# Patient Record
Sex: Female | Born: 1965
Health system: Southern US, Community
[De-identification: ages and names within clinical notes are randomized; demographics above are authoritative.]

## PROBLEM LIST (undated history)

## (undated) DIAGNOSIS — F3289 Other specified depressive episodes: Secondary | ICD-10-CM

## (undated) DIAGNOSIS — R011 Cardiac murmur, unspecified: Secondary | ICD-10-CM

## (undated) DIAGNOSIS — E7800 Pure hypercholesterolemia, unspecified: Secondary | ICD-10-CM

## (undated) DIAGNOSIS — B001 Herpesviral vesicular dermatitis: Secondary | ICD-10-CM

## (undated) DIAGNOSIS — K635 Polyp of colon: Secondary | ICD-10-CM

## (undated) DIAGNOSIS — F329 Major depressive disorder, single episode, unspecified: Secondary | ICD-10-CM

## (undated) DIAGNOSIS — T7840XA Allergy, unspecified, initial encounter: Secondary | ICD-10-CM

## (undated) DIAGNOSIS — K5790 Diverticulosis of intestine, part unspecified, without perforation or abscess without bleeding: Secondary | ICD-10-CM

## (undated) DIAGNOSIS — K648 Other hemorrhoids: Secondary | ICD-10-CM

## (undated) DIAGNOSIS — E559 Vitamin D deficiency, unspecified: Secondary | ICD-10-CM

## (undated) HISTORY — DX: Polyp of colon: K63.5

## (undated) HISTORY — DX: Other hemorrhoids: K64.8

## (undated) HISTORY — DX: Major depressive disorder, single episode, unspecified: F32.9

## (undated) HISTORY — DX: Diverticulosis of intestine, part unspecified, without perforation or abscess without bleeding: K57.90

## (undated) HISTORY — DX: Other specified depressive episodes: F32.89

## (undated) HISTORY — DX: Cardiac murmur, unspecified: R01.1

## (undated) HISTORY — DX: Pure hypercholesterolemia, unspecified: E78.00

## (undated) HISTORY — PX: WISDOM TOOTH EXTRACTION: SHX21

## (undated) HISTORY — DX: Vitamin D deficiency, unspecified: E55.9

## (undated) HISTORY — DX: Allergy, unspecified, initial encounter: T78.40XA

## (undated) HISTORY — DX: Herpesviral vesicular dermatitis: B00.1

---

## 1997-08-12 ENCOUNTER — Other Ambulatory Visit: Admission: RE | Admit: 1997-08-12 | Discharge: 1997-08-12 | Payer: Self-pay | Admitting: *Deleted

## 1998-02-27 ENCOUNTER — Inpatient Hospital Stay (HOSPITAL_COMMUNITY): Admission: AD | Admit: 1998-02-27 | Discharge: 1998-02-27 | Payer: Self-pay | Admitting: *Deleted

## 1998-03-23 ENCOUNTER — Inpatient Hospital Stay (HOSPITAL_COMMUNITY): Admission: AD | Admit: 1998-03-23 | Discharge: 1998-03-25 | Payer: Self-pay | Admitting: *Deleted

## 1998-04-30 ENCOUNTER — Other Ambulatory Visit: Admission: RE | Admit: 1998-04-30 | Discharge: 1998-04-30 | Payer: Self-pay | Admitting: *Deleted

## 2000-11-28 ENCOUNTER — Other Ambulatory Visit: Admission: RE | Admit: 2000-11-28 | Discharge: 2000-11-28 | Payer: Self-pay | Admitting: *Deleted

## 2002-04-01 ENCOUNTER — Ambulatory Visit (HOSPITAL_COMMUNITY): Admission: RE | Admit: 2002-04-01 | Discharge: 2002-04-01 | Payer: Self-pay | Admitting: Obstetrics and Gynecology

## 2002-04-01 ENCOUNTER — Encounter: Payer: Self-pay | Admitting: Obstetrics and Gynecology

## 2002-04-08 ENCOUNTER — Other Ambulatory Visit: Admission: RE | Admit: 2002-04-08 | Discharge: 2002-04-08 | Payer: Self-pay | Admitting: Obstetrics and Gynecology

## 2004-04-14 ENCOUNTER — Other Ambulatory Visit: Admission: RE | Admit: 2004-04-14 | Discharge: 2004-04-14 | Payer: Self-pay | Admitting: Obstetrics and Gynecology

## 2004-06-21 ENCOUNTER — Encounter: Admission: RE | Admit: 2004-06-21 | Discharge: 2004-06-21 | Payer: Self-pay | Admitting: Family Medicine

## 2004-10-20 ENCOUNTER — Encounter: Admission: RE | Admit: 2004-10-20 | Discharge: 2004-10-20 | Payer: Self-pay | Admitting: Internal Medicine

## 2006-08-29 ENCOUNTER — Ambulatory Visit (HOSPITAL_COMMUNITY): Admission: RE | Admit: 2006-08-29 | Discharge: 2006-08-29 | Payer: Self-pay | Admitting: Family Medicine

## 2006-10-11 ENCOUNTER — Other Ambulatory Visit: Admission: RE | Admit: 2006-10-11 | Discharge: 2006-10-11 | Payer: Self-pay | Admitting: Endocrinology

## 2006-10-11 LAB — HM PAP SMEAR: HM Pap smear: NORMAL

## 2006-12-25 IMAGING — CR DG CHEST 2V
2 series · 2 of 2 positions shown · non-contrast
Comparison: None.

CLINICAL DATA: Cough. 
 CHEST, TWO VIEWS

[view not recorded (1 of 2)]
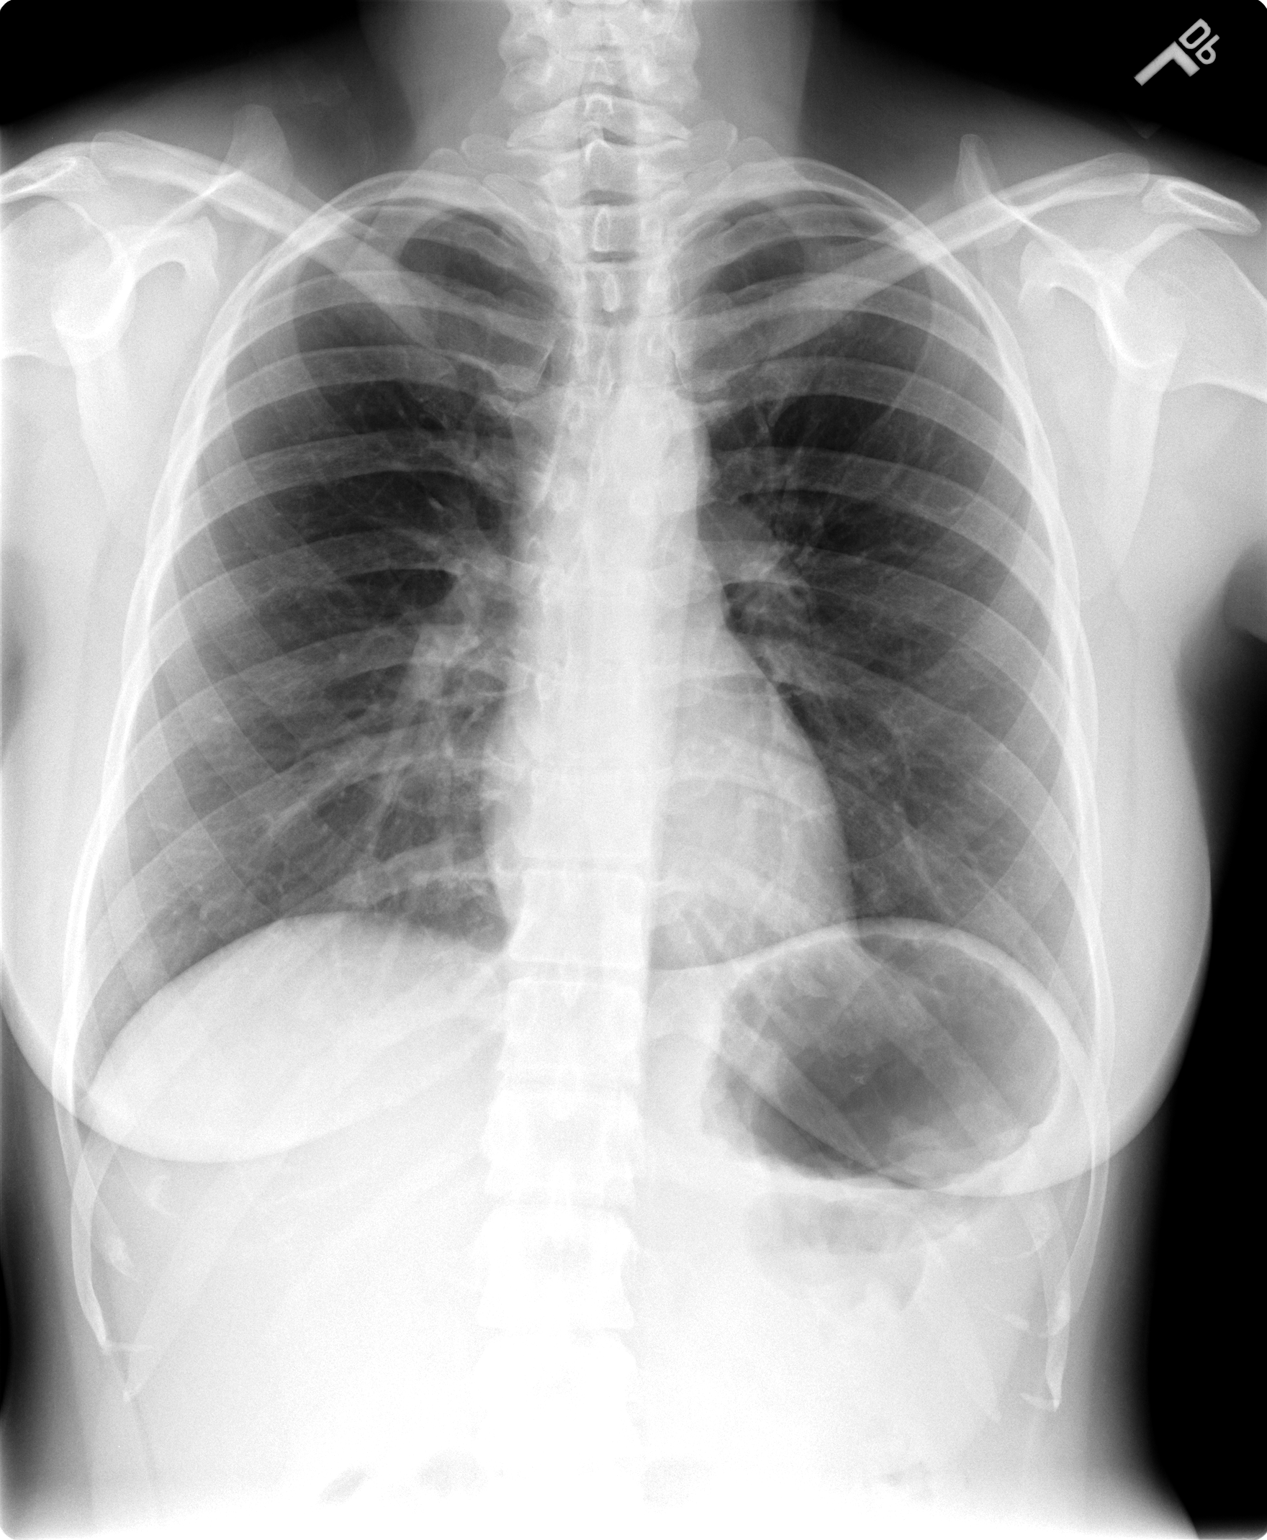

[view not recorded (2 of 2)]
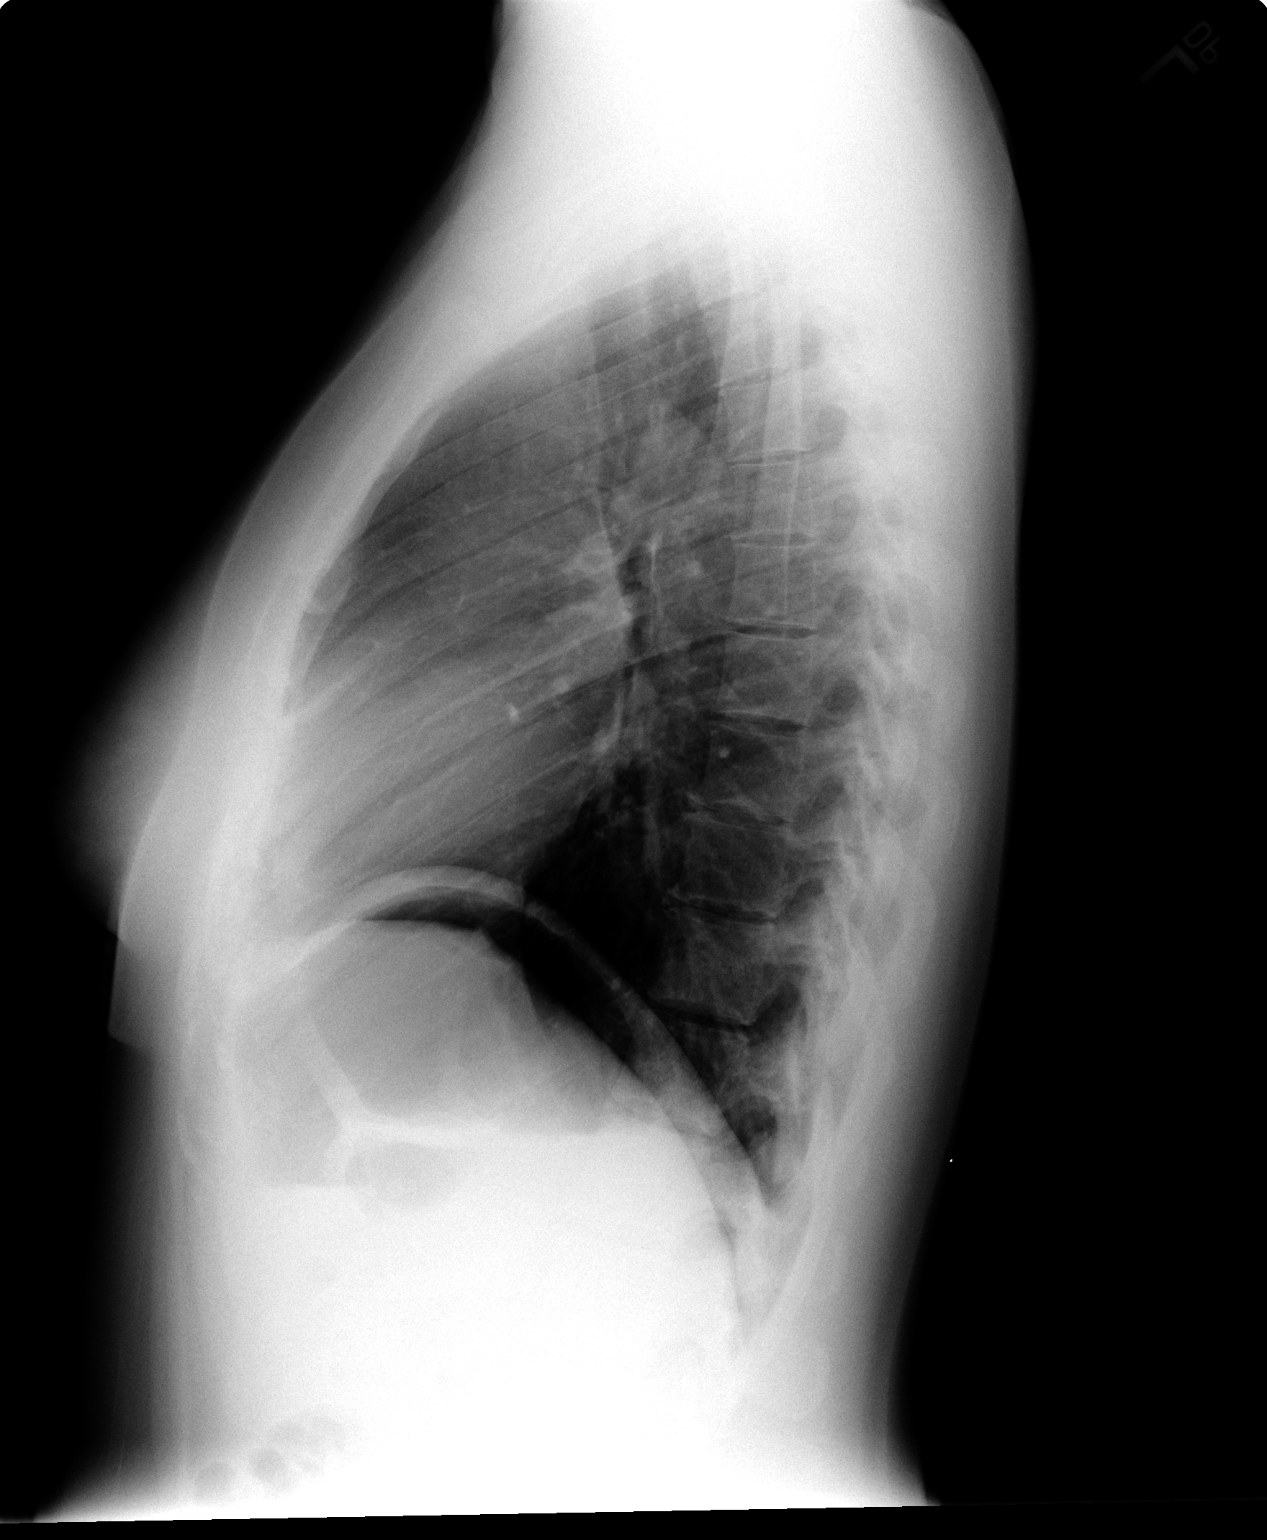

[2 of 2 positions shown; findings below may reference images not displayed]

Heart and lungs normal.  No pleural fluid or osseous lesions.  There is a mild thoracolumbar scoliosis.
IMPRESSION: No active cardiopulmonary disease.

## 2010-10-20 ENCOUNTER — Encounter: Payer: Self-pay | Admitting: Family Medicine

## 2010-10-20 ENCOUNTER — Ambulatory Visit (INDEPENDENT_AMBULATORY_CARE_PROVIDER_SITE_OTHER): Payer: Managed Care, Other (non HMO) | Admitting: Family Medicine

## 2010-10-20 VITALS — BP 116/74 | HR 68 | Ht 62.0 in | Wt 146.0 lb

## 2010-10-20 DIAGNOSIS — M7630 Iliotibial band syndrome, unspecified leg: Secondary | ICD-10-CM

## 2010-10-20 DIAGNOSIS — M629 Disorder of muscle, unspecified: Secondary | ICD-10-CM

## 2010-10-20 DIAGNOSIS — B009 Herpesviral infection, unspecified: Secondary | ICD-10-CM

## 2010-10-20 MED ORDER — CELECOXIB 200 MG PO CAPS: 200.0000 mg | ORAL_CAPSULE | Freq: Every day | ORAL | Status: DC

## 2010-10-20 MED ORDER — VALACYCLOVIR HCL 1 G PO TABS: 500.0000 mg | ORAL_TABLET | Freq: Two times a day (BID) | ORAL | Status: DC

## 2010-10-20 NOTE — Progress Notes (Signed)
Chief complaint: right hip pain x 2 years, left shoulder x 1 year and left wrist. Took some Celebrex and feels much better. (declines flu shot)  Exercises up to 3x/week, and walks twice a week x 30 minutes 2 years ago started having R hip pain when walking a lot.  Has been ongoing since then.  Tender to touch on the bone.  Sometimes, sporadically will also have R knee pain.  L shoulder pain x 1 year.  Started after getting new pillows.  Feels bruised to touch on the bone (acromion).  Denies having any pain with movement.  Slight tingling in L 4th and 5th fingers  Discomfort is 2/10, not bad, tolerable.  3 months ago, started aching in both knees, both hips, ached all over getting out of bed--back, shoulders, everywhere.  Started having pain in L hand, lifting anything (purse, opening water bottle). Felt sore all over like that for about 8 weeks--pain was in multiple joints.  3 weeks ago, she tried her mother's Celebrex, and the next day she was better. Took 3 pills in a row, felt much better.  On a cruise last week, didn't take the Celebrex because of being on cruise, taking Dramamine, drinking wine.  Only took it 2-3 times over the last week.  Now is back to baseline, just pain at R hip and L shoulder.   No fevers, tick bites, URI symptoms.  Had tried glucosamine a year ago, not taking anymore (didn't help).  Denies family history of autoimmune diseases.  Never had any rash  Past Medical History  Diagnosis Date  . Herpes labialis     also gets outbreaks of herpes infection on her R thumb    History reviewed. No pertinent past surgical history.  History   Social History  . Marital Status: Married    Spouse Name: N/A    Number of Children: 2  . Years of Education: N/A   Occupational History  . 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 Smoking status: Never Smoker   . Smokeless tobacco: Never Used  . Alcohol Use: Yes     3-6 glasses per week (1-2 bottles of  wine/week)  . Drug Use: No  . Sexually Active: Not on file   Other Topics Concern  . Not on file   Social History Narrative   Separated, soon to be legally divorced    Family History  Problem Relation Age of Onset  . Asthma Mother   . Hypertension Father   . Asthma Son   . Cancer Maternal Aunt     melanoma    Current outpatient prescriptions:valACYclovir (VALTREX) 1000 MG tablet, Take 0.5 tablets (500 mg total) by mouth 2 (two) times daily. As directed, as needed for recurrent infection, Disp: 30 tablet, Rfl: 1;  celecoxib (CELEBREX) 200 MG capsule, Take 1 capsule (200 mg total) by mouth daily. Take as needed for pain, but use regularly for at least a week initially, Disp: 30 capsule, Rfl: 0  No Known Allergies  ROS:  See above--no fevers, weight changes, skin rash, GI complaints, GU complaints, chest pain, URI complaints, SOB or other concerns, except MSK as per HPI  PHYSICAL EXAM: BP 116/74  Pulse 68  Ht 5\' 2"  (1.575 m)  Wt 146 lb (66.225 kg)  BMI 26.70 kg/m2  LMP 10/17/2010 Pleasant female, in no distress Neck: no lymphadenopathy or mass, C-spine nontender.  Tender L posterior acromion.  No erythema, warmth.  Normal strength, FROM of  both UE's. Tender at lateral R ASIS, slightly tender along ITB.  Nontender at R trochanteric bursa.  FROM of hip and knee.  Pain and decreased range of motion with bending forward with left leg crossed in front of right (pain in R hip) Heart: regular rate and rhythm Lungs: clear Abdomen: nontender Skin: no rash Psych: normal mood, affect, hygiene and grooming  ASSESSMENT/PLAN: 1. Iliotibial band syndrome  celecoxib (CELEBREX) 200 MG capsule, DISCONTINUED: celecoxib (CELEBREX) 200 MG capsule  2. HSV (herpes simplex virus) infection  valACYclovir (VALTREX) 1000 MG tablet, DISCONTINUED: valACYclovir (VALTREX) 1000 MG tablet   Discussed ice/heat, taught proper stretches Likely the shoulder pain will also improve with anti-inflammatories,  doesn't appear to be a problem with the joint itself.  If Celebrex not covered by insurance, discussed that we'd change to Naprosyn. Take meds regularly for 1-2 weeks.  Consider PT or chiro (with ART--ie Dr. Vear Clock) if ongoing/worsening pain  F/u for CPE/pap

## 2010-10-20 NOTE — Patient Instructions (Addendum)
ITB (iliotibeal band) Polyarthritis--? Etiology.  Resolved now.  Return if symptoms recur for thorough work-up  If Celebrex isn't covered, will change to Naprosyn.  Naproxyn needs to be taken twice daily, and take with food   Iliotibial Band Syndrome Iliotibial band syndrome is pain in the outer, upper thigh. The pain is due to an inflammation (soreness) of the iliotibial band. This is a band of thick fibrous tissue that runs down the outside of the thigh. The iliotibial band begins at the hip. It extends to the outer side of the shin bone (tibia) just below the knee joint. The band works with the thigh muscles. Together they provide stability to the outside of the knee joint. Iliotibial band syndrome (ITBS) occurs when there is inflammation to this band of tissue. The irritation usually occurs over the outside of the knee joint, at the lateral epicondyle (the end of the femur bone.) The iliotibial band crosses bone and muscle at this point. Between these structures is a bursa (a cushioning sac). The bursa should make possible a smooth gliding motion. However, when inflamed, the iliotibial band does not glide easily. When inflamed, there is pain with motion of the knee. Usually the pain worsens with continued movement and the pain goes away with rest. This problem usually arises when there is a sudden increase in sports activities involving the lower extremities (your legs). Running, and playing soccer or basketball are examples of activities causing this. Others who are prone to ITBS include individuals with mechanical problems such as leg length differences, abnormality of walking, bowed legs etc. This diagnosis (learning what is wrong) is made by examination. X-rays are usually normal if only soft tissue inflammation is present. Treatment of ITBS begins with proper footwear, icing the area of pain, stretching, and resting for a period of time. Incorporating low-impact cross-training activities may also  help. Your caregiver may prescribe anti-inflammatory medications as well. HOME CARE INSTRUCTIONS  Apply ice to the sore area for 15 minutes, 2-3 times per day. Put the ice in a plastic bag and place a towel between the bag of ice and your skin.   Limit excessive training or eliminate training until pain goes away.   While pain is present, you may use gentle range of motion. Do not resume regular use until instructed by your caregiver. Begin use gradually. Do not increase use to the point of pain. If pain does develop, decrease use and continue the above measures. Gradually increase activities that do not cause discomfort. Do this until you finally achieve normal use.   Perform low impact activities while pain is present. Wear proper footwear.   Only take over-the-counter or prescription medicines for pain, discomfort, or fever as directed by your caregiver.  SEEK MEDICAL CARE IF:  Your pain increases or pain is not controlled with medications.   You develop new, unexplained symptoms (problems), or an increase of the symptoms that brought you to your caregiver.   Your pain and symptoms are not improving or are getting worse.  Document Released: 07/01/2001 Document Re-Released: 04/07/2008 [facility] Patient Information 2011 [place], [place].

## 2010-10-26 ENCOUNTER — Encounter: Payer: Self-pay | Admitting: Family Medicine

## 2010-10-26 ENCOUNTER — Other Ambulatory Visit: Payer: Self-pay | Admitting: Family Medicine

## 2010-10-26 DIAGNOSIS — M7630 Iliotibial band syndrome, unspecified leg: Secondary | ICD-10-CM

## 2010-10-26 MED ORDER — NAPROXEN 500 MG PO TABS: 500.0000 mg | ORAL_TABLET | Freq: Two times a day (BID) | ORAL | Status: DC

## 2010-10-27 ENCOUNTER — Encounter: Payer: Self-pay | Admitting: Family Medicine

## 2010-12-01 ENCOUNTER — Encounter: Payer: Self-pay | Admitting: Family Medicine

## 2010-12-01 ENCOUNTER — Other Ambulatory Visit (HOSPITAL_COMMUNITY)
Admission: RE | Admit: 2010-12-01 | Discharge: 2010-12-01 | Disposition: A | Discharge status: Home / Self Care | Payer: Managed Care, Other (non HMO) | Source: Ambulatory Visit | Attending: Family Medicine | Admitting: Family Medicine

## 2010-12-01 ENCOUNTER — Ambulatory Visit (INDEPENDENT_AMBULATORY_CARE_PROVIDER_SITE_OTHER): Payer: Managed Care, Other (non HMO) | Admitting: Family Medicine

## 2010-12-01 VITALS — BP 114/76 | HR 68 | Ht 62.0 in | Wt 146.0 lb

## 2010-12-01 DIAGNOSIS — R5383 Other fatigue: Secondary | ICD-10-CM

## 2010-12-01 DIAGNOSIS — Z23 Encounter for immunization: Secondary | ICD-10-CM

## 2010-12-01 DIAGNOSIS — Z0000 Encounter for general adult medical examination without abnormal findings: Secondary | ICD-10-CM

## 2010-12-01 DIAGNOSIS — R5381 Other malaise: Secondary | ICD-10-CM

## 2010-12-01 DIAGNOSIS — E7800 Pure hypercholesterolemia, unspecified: Secondary | ICD-10-CM

## 2010-12-01 DIAGNOSIS — Z113 Encounter for screening for infections with a predominantly sexual mode of transmission: Secondary | ICD-10-CM | POA: Insufficient documentation

## 2010-12-01 DIAGNOSIS — Z01419 Encounter for gynecological examination (general) (routine) without abnormal findings: Secondary | ICD-10-CM | POA: Insufficient documentation

## 2010-12-01 LAB — CBC WITH DIFFERENTIAL/PLATELET
Basophils Absolute: 0 10*3/uL (ref 0.0–0.1)
Basophils Relative: 1 % (ref 0–1)
Eosinophils Absolute: 0.1 10*3/uL (ref 0.0–0.7)
HCT: 43.3 % (ref 36.0–46.0)
MCH: 30.9 pg (ref 26.0–34.0)
MCHC: 32.6 g/dL (ref 30.0–36.0)
Monocytes Absolute: 0.3 10*3/uL (ref 0.1–1.0)
Neutro Abs: 3.7 10*3/uL (ref 1.7–7.7)
RDW: 12.7 % (ref 11.5–15.5)

## 2010-12-01 LAB — LIPID PANEL
HDL: 73 mg/dL (ref >39)
LDL Cholesterol: 128 mg/dL — ABNORMAL HIGH (ref 0–99)
Total CHOL/HDL Ratio: 3.2 Ratio
Triglycerides: 153 mg/dL — ABNORMAL HIGH (ref <150)
VLDL: 31 mg/dL (ref 0–40)

## 2010-12-01 LAB — COMPREHENSIVE METABOLIC PANEL
AST: 16 U/L (ref 0–37)
Albumin: 4.8 g/dL (ref 3.5–5.2)
Alkaline Phosphatase: 58 U/L (ref 39–117)
BUN: 14 mg/dL (ref 6–23)
Creat: 0.75 mg/dL (ref 0.50–1.10)
Glucose, Bld: 84 mg/dL (ref 70–99)
Potassium: 5 mEq/L (ref 3.5–5.3)
Total Bilirubin: 0.5 mg/dL (ref 0.3–1.2)

## 2010-12-01 LAB — POCT URINALYSIS DIPSTICK
Bilirubin, UA: NEGATIVE
Glucose, UA: NEGATIVE
Leukocytes, UA: NEGATIVE
Nitrite, UA: NEGATIVE
Urobilinogen, UA: NEGATIVE
pH, UA: 5

## 2010-12-01 LAB — HM PAP SMEAR: HM Pap smear: NORMAL

## 2010-12-01 NOTE — Patient Instructions (Addendum)
HEALTH MAINTENANCE RECOMMENDATIONS:  It is recommended that you get at least 30 minutes of aerobic exercise at least 5 days/week (for weight loss, you may need as much as 60-90 minutes). This can be any activity that gets your heart rate up. This can be divided in 10-15 minute intervals if needed, but try and build up your endurance at least once a week.  Weight bearing exercise is also recommended twice weekly.  Eat a healthy diet with lots of vegetables, fruits and fiber.  "Colorful" foods have a lot of vitamins (ie green vegetables, tomatoes, red peppers, etc).  Limit sweet tea, regular sodas and alcoholic beverages, all of which has a lot of calories and sugar.  Up to 1 alcoholic drink daily may be beneficial for women (unless trying to lose weight, watch sugars).  Drink a lot of water.  Calcium recommendations are 1200-1500 mg daily (1500 mg for postmenopausal women or women without ovaries), and vitamin D 1000 IU daily.  This should be obtained from diet and/or supplements (vitamins), and calcium should not be taken all at once, but in divided doses.  Monthly self breast exams and yearly mammograms for women over the age of 18 is recommended.  Sunscreen of at least SPF 30 should be used on all sun-exposed parts of the skin when outside between the hours of 10 am and 4 pm (not just when at beach or pool, but even with exercise, golf, tennis, and yard work!)  Use a sunscreen that says "broad spectrum" so it covers both UVA and UVB rays, and make sure to reapply every 1-2 hours.  Remember to change the batteries in your smoke detectors when changing your clock times in the spring and fall.  Use your seat belt every time you are in a car, and please drive safely and not be distracted with cell phones and texting while driving.  Routine eye exam is recommended

## 2010-12-01 NOTE — Progress Notes (Signed)
Sherry Carter is a 45 y.o. female who presents for a complete physical.  She has the following concerns: None.  Needs tetanus shot, and wants labs done.  She is fasting today.  She is in a sexual relationship with someone who has had a vasectomy, not using condoms.  Has been with him for a year.  Declines HIV testing that was offered.  Immunization History  Administered Date(s) Administered  . Tdap 12/01/2010   Last Pap smear: 2008 Last mammogram: 2008 at [facility] Last colonoscopy: never Last DEXA: never Dentist: twice yearly Ophtho: never Exercise: walks twice a week, 45 minutes. Has an elliptical and she plans to try and exercise more frequently  Past Medical History  Diagnosis Date  . Herpes labialis     also gets outbreaks of herpes infection on her R thumb  . Depressive disorder, not elsewhere classified   . Pure hypercholesterolemia     History reviewed. No pertinent past surgical history.  History   Social History  . Marital Status: Married    Spouse Name: N/A    Number of Children: 2  . Years of Education: N/A   Occupational History  . 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 Smoking status: Never Smoker   . Smokeless tobacco: Never Used  . Alcohol Use: Yes     3-6 glasses per week (1-2 bottles of wine/week)  . Drug Use: No  . Sexually Active: Not on file   Other Topics Concern  . Not on file   Social History Narrative   Separated, soon to be legally divorced    Family History  Problem Relation Age of Onset  . Asthma Mother   . Hypertension Father   . Asthma Son   . Cancer Maternal Aunt     melanoma    Current outpatient prescriptions:naproxen (NAPROSYN) 500 MG tablet, Take 1 tablet (500 mg total) by mouth 2 (two) times daily with a meal. After initial symptoms resolve, may use as needed for pain, Disp: 30 tablet, Rfl: 1;  valACYclovir (VALTREX) 1000 MG tablet, Take 0.5 tablets (500 mg total) by mouth 2 (two) times  daily. As directed, as needed for recurrent infection, Disp: 30 tablet, Rfl: 1  No Known Allergies  ROS:  The patient denies anorexia, fever, weight changes, headaches,  vision changes, decreased hearing, ear pain, sore throat, breast concerns, chest pain, palpitations, dizziness, syncope, dyspnea on exertion, cough, swelling, nausea, vomiting, diarrhea, constipation, abdominal pain, melena, hematochezia, indigestion/heartburn, hematuria, incontinence, dysuria, irregular menstrual cycles (twice in the last 2 years had a very heavy period), vaginal discharge, odor or itch, genital lesions, numbness, tingling, weakness, tremor, suspicious skin lesions, depression, anxiety, abnormal bleeding/bruising, or enlarged lymph nodes. +R hip, knee pain intermittently, relieved by Aleve.  Some shoulder pain when she wakes up in the morning (the side she slept on)  PHYSICAL EXAM: BP 114/76  Pulse 68  Ht 5\' 2"  (1.575 m)  Wt 146 lb (66.225 kg)  BMI 26.70 kg/m2  LMP 11/02/2010  General Appearance:    Alert, cooperative, no distress, appears stated age  Head:    Normocephalic, without obvious abnormality, atraumatic  Eyes:    PERRL, conjunctiva/corneas clear, EOM's intact, fundi    benign  Ears:    Normal TM's and external ear canals  Nose:   Nares normal, mucosa normal, no drainage or sinus   tenderness  Throat:   Lips, mucosa, and tongue normal; teeth and gums normal  Neck:  Supple, no lymphadenopathy;  thyroid:  no   enlargement/tenderness/nodules; no carotid   bruit or JVD  Back:    Spine nontender, no curvature, ROM normal, no CVA     tenderness  Lungs:     Clear to auscultation bilaterally without wheezes, rales or     ronchi; respirations unlabored  Chest Wall:    No tenderness or deformity   Heart:    Regular rate and rhythm, S1 and S2 normal, no murmur, rub   or gallop  Breast Exam:    No tenderness, masses, or nipple discharge or inversion.      No axillary lymphadenopathy  Abdomen:     Soft,  non-tender, nondistended, normoactive bowel sounds,    no masses, no hepatosplenomegaly  Genitalia:    Normal external genitalia without lesions.  BUS and vagina normal; cervix without lesions, or cervical motion tenderness. Small amount of yellow discharge at cervical os.  Uterus and adnexa not enlarged, nontender, no masses.  Pap performed  Rectal:    Exam declined by patient.  Extremities:   No clubbing, cyanosis or edema  Pulses:   2+ and symmetric all extremities  Skin:   Skin color, texture, turgor normal, no rashes or lesions  Lymph nodes:   Cervical, supraclavicular, and axillary nodes normal  Neurologic:   CNII-XII intact, normal strength, sensation and gait; reflexes 2+ and symmetric throughout          Psych:   Normal mood, affect, hygiene and grooming.     ASSESSMENT/PLAN:  1. Routine general medical examination at a health care facility  POCT Urinalysis Dipstick, Visual acuity screening, Vitamin D 25 hydroxy, CBC with Differential, Comprehensive metabolic panel, Lipid panel, TSH, Cytology - PAP  2. Need for Tdap vaccination  Tdap vaccine greater than or equal to 7yo IM  3. Pure hypercholesterolemia  Lipid panel   borderline lipids in past, with good HDL.  Last checked in 2008  4. Other malaise and fatigue  Vitamin D 25 hydroxy, CBC with Differential, Comprehensive metabolic panel, TSH   Discussed monthly self breast exams and yearly mammograms after the age of 28; at least 30 minutes of aerobic activity at least 5 days/week; proper sunscreen use reviewed; healthy diet, including goals of calcium and vitamin D intake and alcohol recommendations (less than or equal to 1 drink/day) reviewed; regular seatbelt use; changing batteries in smoke detectors.  Immunization recommendations discussed--TdaP given, declines flu shot.  Colonoscopy recommendations reviewed--age 14.  Hemoccult cards given.  Routine eye exam is recommended.  Discussed need for annual mammograms and self breast exams  (not doing)

## 2010-12-02 ENCOUNTER — Encounter: Payer: Self-pay | Admitting: Family Medicine

## 2010-12-02 LAB — VITAMIN D 25 HYDROXY (VIT D DEFICIENCY, FRACTURES): Vit D, 25-Hydroxy: 26 ng/mL — ABNORMAL LOW (ref 30–89)

## 2010-12-05 ENCOUNTER — Encounter: Payer: Self-pay | Admitting: Family Medicine

## 2011-03-08 ENCOUNTER — Ambulatory Visit (INDEPENDENT_AMBULATORY_CARE_PROVIDER_SITE_OTHER): Payer: Managed Care, Other (non HMO) | Admitting: Family Medicine

## 2011-03-08 ENCOUNTER — Encounter: Payer: Self-pay | Admitting: Family Medicine

## 2011-03-08 ENCOUNTER — Telehealth: Payer: Self-pay | Admitting: Internal Medicine

## 2011-03-08 DIAGNOSIS — F325 Major depressive disorder, single episode, in full remission: Secondary | ICD-10-CM | POA: Insufficient documentation

## 2011-03-08 DIAGNOSIS — F3289 Other specified depressive episodes: Secondary | ICD-10-CM

## 2011-03-08 DIAGNOSIS — F329 Major depressive disorder, single episode, unspecified: Secondary | ICD-10-CM

## 2011-03-08 DIAGNOSIS — M25551 Pain in right hip: Secondary | ICD-10-CM

## 2011-03-08 DIAGNOSIS — M25559 Pain in unspecified hip: Secondary | ICD-10-CM

## 2011-03-08 MED ORDER — METHYLPREDNISOLONE (PAK) 4 MG PO TABS: ORAL_TABLET | ORAL | Status: AC

## 2011-03-08 MED ORDER — CITALOPRAM HYDROBROMIDE 20 MG PO TABS: ORAL_TABLET | ORAL | Status: DC

## 2011-03-08 NOTE — Telephone Encounter (Signed)
Given that her symptoms now are significantly different from my exam in November, with new numbness and tingling, OV is recommended for evaluation, to determine what treatment is most appropriate.  When we refer, we need to give a diagnosis, and not sure who would be most appropriate to refer to until examined.

## 2011-03-08 NOTE — Telephone Encounter (Signed)
Called patient and she is coming in today @ 3:45 for eval of her right hip.

## 2011-03-08 NOTE — Progress Notes (Signed)
Chief complaint: right hip pain, numbness and tingling down her leg, even in her big toe. Mostly after sitting or laying down, even for 10 minutes  HPI:  R hip pain has persisted.  Has gotten worse over the last 30 days, needing to take the naproxen when she wakes up in the mornings, when it seems to be hurting a lot.  Gets better during the day.  Wakes up with it again the next day. Awoke 2 days ago, unable to walk due to the pain.  Pain is still in the same spot. When she steps on the right foot to get out of bed, pain radiates from the R hip (where pain has been ongoing), and shoots down the side of the leg.  Leg feels tingly, like it is asleep. Some achiness to the whole R leg. Tingling has been constant x 2 days.  She increased the naproxen to BID, even took Valtrex, trying to help the pain today.  Feels bruisedat the mid lateral thigh, and foot is asleep right now. Denies any back pain, just slight sore spot on left side, not new.  L shoulder and wrist pain, as discussed at previous visit, hasresolved  She reports being under a lot of stress at home, related to daughter. Her daughter Wyn Forster has been put on Prozac, getting weekly therapy/counseling, and has major behavioral issues.  Patient has been getting individual counseling as well, but mainly related to parenting techniques for Madison's behavior.  She has a h/o depression, previously took Wellbutrin.  She recalls feeling that maybe it made her a little too easygoing, and didn't like how she felt when mixed with alcohol, although she admits she was drinking a lot more back then compared to now (now just drinking some wine, previously drinking vodka). She recalls perhaps being more forgetful while on the medication also.   She has some frequent awakening, but is able to fall back asleep. Denies suicidal ideation, some crying, irritability.  Past Medical History  Diagnosis Date  . Herpes labialis     also gets outbreaks of herpes infection on  her R thumb  . Depressive disorder, not elsewhere classified   . Pure hypercholesterolemia     No past surgical history on file.  History   Social History  . Marital Status: Married    Spouse Name: N/A    Number of Children: 2  . Years of Education: N/A   Occupational History  . 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 Smoking status: Never Smoker   . Smokeless tobacco: Never Used  . Alcohol Use: Yes     3-6 glasses per week (1-2 bottles of wine/week)  . Drug Use: No  . Sexually Active: Not on file   Other Topics Concern  . Not on file   Social History Narrative   Separated, soon to be legally divorced    Family History  Problem Relation Age of Onset  . Asthma Mother   . Hypertension Father   . Asthma Son   . Cancer Maternal Aunt     melanoma   Current Outpatient Prescriptions on File Prior to Visit  Medication Sig Dispense Refill  . naproxen (NAPROSYN) 500 MG tablet Take 1 tablet (500 mg total) by mouth 2 (two) times daily with a meal. After initial symptoms resolve, may use as needed for pain  30 tablet  1  . valACYclovir (VALTREX) 1000 MG tablet Take 0.5 tablets (500 mg total) by mouth 2 (  two) times daily. As directed, as needed for recurrent infection  30 tablet  1   No Known Allergies  ROS:  Denies fevers, URI symptoms, chest pain, palpitations, shortness of breath, GI complaints. Denies back pain, weakness. No skin rashes/lesions or other concerns  PHYSICAL EXAM: BP 118/76  Pulse 72  Ht 5\' 2"  (1.575 m)  Wt 146 lb (66.225 kg)  BMI 26.70 kg/m2  LMP 02/22/2011 Well developed, pleasant female, in no acute distress. Became tearful at end of visit. Back: no CVA tenderness.  Very mildly tender at L SI joint.  No spinal tenderness or R SI tenderness. No pyriformis spasm or tenderness at sciatic notch.  Tender to palpation just inferior and lateral to ASIS, over muscles, no bony tenderness.  FROM of hip without pain. Nontender over  trochanteric bursa. Negative straight leg raise.  Normal strength, sensation.  Brisk reflexes, symmetric bilaterally.   Skin: intact, no rashes Psych:  Normal hygiene, grooming, eye contact and speech.  Became tearful during visit, appears mildly depressed  ASSESSMENT/PLAN: 1. Hip pain, right  methylPREDNIsolone (MEDROL DOSPACK) 4 MG tablet  2. Depressive disorder, not elsewhere classified  citalopram (CELEXA) 20 MG tablet   Hip pain--tenderness is over tensor fascia lata and ITB.  However this doesn't explain the new onset of paresthesias and aching in the whole leg.  Normal neurologic exam, and no response to NSAIDs, therefore will stop the naprosyn and treat with oral steroids. Risks reviewed. Use tylenol if needed for pain.  Call for Rx (vicodin or ultram) if needed  Try icing the hip, or trial of heat, or alternating heat/cold. Continue stretches.  Consider PT vs chiro vs referral to sports med/ortho doc if not improving with these measures  Depression--discussed restarting Wellbutrin, vs changing to SSRI.  Discussed and reviewed risks/side effects of Celexa.  Start at 1/2 tablet for 1-2 weeks, then increase to full tablet as tolerated.  If having side effects, may start lower, taper up slower.  Encouraged to continue counseling.  F/u 4-6 weeks, sooner prn for hip/leg pain

## 2011-03-08 NOTE — Telephone Encounter (Signed)
pt states that when she was last here in november for her yearly physical, yall talked about her hip pain. however since monday she states it has gotten much worse and it has gone down her left leg and left foot and its tingling and numb. She would like to be referred.

## 2011-03-08 NOTE — Patient Instructions (Addendum)
Take the steroids as directed.  Stop the naprosyn. Use tylenol if needed for pain.  Call for Rx (vicodin or ultram) if needed  Try icing the hip, or trial of heat, or alternating heat/cold  Consider PT vs chiro vs referral to sports med/ortho doc.  The chiropractor that I recommend is Sherry Carter at [facility] ([facility]on [address] across from [employer]. He is certified in active release technique, which is a soft tissue technique that makes him different than some other chiropractors. If chiro isn't covered by insurance, than physical therapy would also help.   (Sherry Carter--[facility])  Continue counseling.  Call for lower dose of citalopram if you aren't tolerating 1/2 tablet, but can't cut it in 1/4ths.   Increase gradually to full tablet.  Follow up in 4-6 weeks, sooner if needed

## 2011-04-05 ENCOUNTER — Ambulatory Visit: Payer: Managed Care, Other (non HMO) | Admitting: Family Medicine

## 2011-04-10 ENCOUNTER — Ambulatory Visit (INDEPENDENT_AMBULATORY_CARE_PROVIDER_SITE_OTHER): Payer: Managed Care, Other (non HMO) | Admitting: Family Medicine

## 2011-04-10 ENCOUNTER — Encounter: Payer: Self-pay | Admitting: Family Medicine

## 2011-04-10 VITALS — BP 122/82 | HR 72 | Ht 62.0 in | Wt 143.0 lb

## 2011-04-10 DIAGNOSIS — F329 Major depressive disorder, single episode, unspecified: Secondary | ICD-10-CM

## 2011-04-10 DIAGNOSIS — M25559 Pain in unspecified hip: Secondary | ICD-10-CM

## 2011-04-10 DIAGNOSIS — M25551 Pain in right hip: Secondary | ICD-10-CM

## 2011-04-10 MED ORDER — CITALOPRAM HYDROBROMIDE 20 MG PO TABS: 20.0000 mg | ORAL_TABLET | Freq: Every day | ORAL | Status: DC

## 2011-04-10 NOTE — Progress Notes (Signed)
Patient presents for follow up on depression.  She was started on celexa x 3.5 weeks ago and is doing much better.  Anger is much shorter-lived, feels more easy-going, but isn't forgetful like when on Wellbutrin in the past.  Denies any side effects.  She is also here to f/u on hip pain.  She took the oral steroids, but didn't get relief right away.  Started feeling better, slowly, the week after taking steroids, gradually continued to improve, and hip pain finally resolved last week.  Still slightly tender to touch on the bone (ASIS), but no longer goes into the leg, or bothers her. Describes it as a 1/10 discomfort, or less   Past Medical History  Diagnosis Date  . Herpes labialis     also gets outbreaks of herpes infection on her R thumb  . Depressive disorder, not elsewhere classified   . Pure hypercholesterolemia    No Known Allergies  ROS:  Denies headaches, nausea, vomiting, diarrhea, numbness, tingling, depression, anxiety or other problems  PHYSICAL EXAM: BP 122/82  Pulse 72  Ht 5\' 2"  (1.575 m)  Wt 143 lb (64.864 kg)  BMI 26.15 kg/m2  LMP 03/20/2011 Well developed, well appearing female in no distress Psych: normal mood, affect, hygiene and grooming.  Full range of affect, no crying today.  Normal speech and eye contact  ASSESSMENT/PLAN: 1. Depressive disorder, not elsewhere classified  citalopram (CELEXA) 20 MG tablet  2. Hip pain, right     Continue Celexa for at least 6 months to a year.  Discussed possibly cutting to 1/2 tab daily for 1-2 months at that time, if stressors are improved (and to continue current dose if no change in stressors).  Discussed the need to taper rather than stop abruptly, but my recommendation is to continue this longterm.  F/u in a year, sooner prn.  Can call for refills if it is still working well for her in 6 months.  Hip pain--much improved.  If recurs, recommend she follow up as we previously discussed (with sports med, ie Dr. Darrick Penna, vs  chiro, Dr. Vear Clock).  Continue stretches

## 2011-08-09 ENCOUNTER — Other Ambulatory Visit: Payer: Self-pay | Admitting: Family Medicine

## 2011-08-09 NOTE — Telephone Encounter (Signed)
Is this okay to refill? 

## 2011-08-09 NOTE — Telephone Encounter (Signed)
done

## 2011-09-21 ENCOUNTER — Other Ambulatory Visit: Payer: Self-pay | Admitting: Family Medicine

## 2011-09-21 DIAGNOSIS — Z1231 Encounter for screening mammogram for malignant neoplasm of breast: Secondary | ICD-10-CM

## 2011-09-21 LAB — HM MAMMOGRAPHY: HM Mammogram: NORMAL

## 2011-10-19 ENCOUNTER — Ambulatory Visit
Admission: RE | Admit: 2011-10-19 | Discharge: 2011-10-19 | Disposition: A | Discharge status: Home / Self Care | Payer: Managed Care, Other (non HMO) | Source: Ambulatory Visit | Attending: Family Medicine | Admitting: Family Medicine

## 2011-10-19 DIAGNOSIS — Z1231 Encounter for screening mammogram for malignant neoplasm of breast: Secondary | ICD-10-CM

## 2011-11-07 ENCOUNTER — Telehealth: Payer: Self-pay | Admitting: Internal Medicine

## 2011-11-07 DIAGNOSIS — M7630 Iliotibial band syndrome, unspecified leg: Secondary | ICD-10-CM

## 2011-11-07 MED ORDER — NAPROXEN 500 MG PO TABS: 500.0000 mg | ORAL_TABLET | Freq: Two times a day (BID) | ORAL | Status: DC

## 2011-11-07 NOTE — Telephone Encounter (Signed)
done

## 2011-12-22 ENCOUNTER — Other Ambulatory Visit: Payer: Self-pay | Admitting: Family Medicine

## 2012-04-26 ENCOUNTER — Other Ambulatory Visit: Payer: Self-pay | Admitting: Family Medicine

## 2012-04-29 NOTE — Telephone Encounter (Signed)
Patient needs to schedule med check appointment for any further refills.

## 2012-05-01 ENCOUNTER — Encounter: Payer: Self-pay | Admitting: Family Medicine

## 2012-05-01 ENCOUNTER — Telehealth: Payer: Self-pay | Admitting: Family Medicine

## 2012-05-01 ENCOUNTER — Ambulatory Visit (INDEPENDENT_AMBULATORY_CARE_PROVIDER_SITE_OTHER): Payer: Managed Care, Other (non HMO) | Admitting: Family Medicine

## 2012-05-01 VITALS — BP 122/76 | HR 72 | Ht 62.0 in | Wt 144.0 lb

## 2012-05-01 DIAGNOSIS — N926 Irregular menstruation, unspecified: Secondary | ICD-10-CM

## 2012-05-01 DIAGNOSIS — F329 Major depressive disorder, single episode, unspecified: Secondary | ICD-10-CM

## 2012-05-01 DIAGNOSIS — B001 Herpesviral vesicular dermatitis: Secondary | ICD-10-CM | POA: Insufficient documentation

## 2012-05-01 DIAGNOSIS — B009 Herpesviral infection, unspecified: Secondary | ICD-10-CM

## 2012-05-01 DIAGNOSIS — N930 Postcoital and contact bleeding: Secondary | ICD-10-CM

## 2012-05-01 MED ORDER — CITALOPRAM HYDROBROMIDE 20 MG PO TABS: ORAL_TABLET | ORAL | Status: DC

## 2012-05-01 MED ORDER — VALACYCLOVIR HCL 1 G PO TABS: ORAL_TABLET | ORAL | Status: DC

## 2012-05-01 NOTE — Progress Notes (Signed)
Chief Complaint  Patient presents with  . Depression    needs refill on Celexa. Also wants refill on her Valtrex.   Patient presents for refills on her medications.  She hasn't been seen in over a year.  Overall, has been doing okay.  She has ongoing stressors re: daughter.  Her daughter continues to get therapy, take meds.  Her son will be going to [employer] next year (got scholarship).  She feels like the medications continues to be helpful, controlling her moods, and she denies any side effects.  She is requesting refill on Valtrex for cold sores.  She has a couple of other concerns today:  "I think I might be sick" Last period was beginning of February.  Had some bleeding after intercourse, twice in the past month (on the two occasions she had sex).  Bleeding was very short-lived, thought she was getting her period at first, but didn't; light bleeding stopped later that day.  She denies possibility of pregnancy--Partner had vasectomy, denies breast tenderness, nausea.  Denies hot flashes. Denies vaginal discharge, pelvic pain.  Recalls having postcoital bleeding in the distant past, self-resolved.  Past Medical History  Diagnosis Date  . Herpes labialis     also gets outbreaks of herpes infection on her R thumb  . Depressive disorder, not elsewhere classified   . Pure hypercholesterolemia     History reviewed. No pertinent past surgical history.  History   Social History  . Marital Status: Married    Spouse Name: N/A    Number of Children: 2  . Years of Education: N/A   Occupational History  . 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 Smoking status: Never Smoker   . Smokeless tobacco: Never Used  . Alcohol Use: Yes     Comment: 3-6 glasses per week (1-2 bottles of wine/week)  . Drug Use: No  . Sexually Active: Not on file   Other Topics Concern  . Not on file   Social History Narrative   Separated, soon to be legally divorced     Family History  Problem Relation Age of Onset  . Asthma Mother   . Hypertension Father   . Asthma Son   . Cancer Maternal Aunt     melanoma  . Cancer Maternal Aunt 60    colon cancer  . Colon cancer Maternal Aunt 60    Current outpatient prescriptions:citalopram (CELEXA) 20 MG tablet, TAKE 1 TABLET (20 MG TOTAL) BY MOUTH DAILY., Disp: 30 tablet, Rfl: 11;  valACYclovir (VALTREX) 1000 MG tablet, TAKE 0.5 TABLETS (500 MG TOTAL) BY MOUTH 2 TIMES DAILY AS DIRECTED, AS NEEDED FOR RECURRENT INFECTION., Disp: 30 tablet, Rfl: 5  No Known Allergies  ROS:  Denies significant weight changes, fevers, URI complaints, cough, shortness of breath, chest pain, nausea, vomiting, bowel changes, hair/skin changes.  Denies dysuria, hematuria, incontinence.  No bleeding/bruising/rashes or other concersn  PHYSICAL EXAM: BP 122/76  Pulse 72  Ht 5\' 2"  (1.575 m)  Wt 144 lb (65.318 kg)  BMI 26.33 kg/m2  LMP 03/01/2012 Well developed, pleasant female in no distress HEENT:  Conjunctiva clear, PERRL Neck: no lymphadenopathy, thyromegaly or mass Heart: regular rate and rhythm Lungs: clear bilaterally Abdomen: soft, nontender, no mass Extremities: no edema Psych: normal mood, affect, hygiene, grooming Neuro: alert and oriented.  Memory intact.  Cranial nerves intact. Normal gait Skin: no rashes  ASSESSMENT/PLAN:  Depressive disorder, not elsewhere classified - Plan: citalopram (CELEXA) 20 MG tablet  Herpes labialis - Plan: valACYclovir (VALTREX) 1000 MG tablet  Irregular menses  Postcoital bleeding  Discussed ddx for missed period, including pregnancy (she denies), anovulatory cycles, perimenopausal. Most likely the latter.  Will continue to monitor. Return if no period in the next 1-2 months (consider provera challenge, vs labs)  Postcoital bleeding.  Differential diagnosis reviewed.  If persists, pt to call and schedule office visit for evaluation prior to physical in July.  Return also if  having pelvic pain, discharge, or other concerns.  Otherwise, f/u at [facility] in July

## 2012-05-01 NOTE — Telephone Encounter (Signed)
Received a medical records request from [facility]. Information requested was faxed to [phone].

## 2012-05-01 NOTE — Patient Instructions (Addendum)
If you do not get a period within 1-2 months, we need to further investigate (hormone/thyroid levels) If you continue to have bleeding after intercourse, please schedule office visit for evaluation prior to your physical in July.  Return also if having pelvic pain, discharge, or other concerns  Correct instructions for valtrex is 2 tablets at onset of cold sore, then repeat in 12 hours (4 tablets per sore)

## 2012-08-22 ENCOUNTER — Encounter: Payer: Self-pay | Admitting: Family Medicine

## 2012-10-23 DIAGNOSIS — E559 Vitamin D deficiency, unspecified: Secondary | ICD-10-CM

## 2012-10-23 HISTORY — DX: Vitamin D deficiency, unspecified: E55.9

## 2012-11-13 ENCOUNTER — Encounter: Payer: Self-pay | Admitting: Family Medicine

## 2012-11-13 ENCOUNTER — Ambulatory Visit (INDEPENDENT_AMBULATORY_CARE_PROVIDER_SITE_OTHER): Payer: BC Managed Care – PPO | Admitting: Family Medicine

## 2012-11-13 ENCOUNTER — Other Ambulatory Visit (HOSPITAL_COMMUNITY)
Admission: RE | Admit: 2012-11-13 | Discharge: 2012-11-13 | Disposition: A | Discharge status: Home / Self Care | Payer: BC Managed Care – PPO | Source: Ambulatory Visit | Attending: Family Medicine | Admitting: Family Medicine

## 2012-11-13 VITALS — BP 118/76 | HR 72 | Ht 61.5 in | Wt 150.0 lb

## 2012-11-13 DIAGNOSIS — Z1151 Encounter for screening for human papillomavirus (HPV): Secondary | ICD-10-CM | POA: Insufficient documentation

## 2012-11-13 DIAGNOSIS — Z113 Encounter for screening for infections with a predominantly sexual mode of transmission: Secondary | ICD-10-CM | POA: Insufficient documentation

## 2012-11-13 DIAGNOSIS — Z01419 Encounter for gynecological examination (general) (routine) without abnormal findings: Secondary | ICD-10-CM | POA: Insufficient documentation

## 2012-11-13 DIAGNOSIS — E782 Mixed hyperlipidemia: Secondary | ICD-10-CM

## 2012-11-13 DIAGNOSIS — F3289 Other specified depressive episodes: Secondary | ICD-10-CM

## 2012-11-13 DIAGNOSIS — F329 Major depressive disorder, single episode, unspecified: Secondary | ICD-10-CM

## 2012-11-13 DIAGNOSIS — Z0000 Encounter for general adult medical examination without abnormal findings: Secondary | ICD-10-CM

## 2012-11-13 DIAGNOSIS — E559 Vitamin D deficiency, unspecified: Secondary | ICD-10-CM

## 2012-11-13 LAB — GLUCOSE, RANDOM: Glucose, Bld: 87 mg/dL (ref 70–99)

## 2012-11-13 LAB — POCT URINALYSIS DIPSTICK
Glucose, UA: NEGATIVE
Nitrite, UA: NEGATIVE
Protein, UA: NEGATIVE
Urobilinogen, UA: NEGATIVE

## 2012-11-13 LAB — LIPID PANEL
HDL: 60 mg/dL (ref >39)
LDL Cholesterol: 70 mg/dL (ref 0–99)

## 2012-11-13 NOTE — Progress Notes (Signed)
Chief Complaint  Patient presents with  . Annual Exam    nonfasting annual exam with pap(blood drawn this morning). Pt declines flu vaccine. UA showed 1+ leuks and trace blood-pt is asymptomatic. No concerns.    Sherry Carter is a 47 y.o. female who presents for a complete physical.  She has no specific concerns, other than desiring weight loss.  Inquiring about phentermine.  Isn't getting very regular exercise--walking the dog, occasional yoga, occasional tennis.  She thinks her period is about to start--slight cramping (and abnormal urine dip)  Depression:  Well controlled with the citalopram.  She doesn't get as upset about stressful issues with her daughter; coping better.  Daughter is starting with 4th therapist next week.  She no longer is going with her, doesn't feel it is needed.  Immunization History  Administered Date(s) Administered  . Tdap 12/01/2010   Last Pap smear: 11/2010 Last mammogram: 09/2011, due now Last colonoscopy: never  Last DEXA: never  Dentist: twice yearly  Ophtho: 1 year ago, needs reading glasses Exercise: walks maybe once a week, and yoga once a week  New relationship x 6 weeks.  He had vasectomy.  Not using condoms  Past Medical History  Diagnosis Date  . Herpes labialis     also gets outbreaks of herpes infection on her R thumb  . Depressive disorder, not elsewhere classified   . Pure hypercholesterolemia     History reviewed. No pertinent past surgical history.  History   Social History  . Marital Status: Divorced    Spouse Name: N/A    Number of Children: 2  . Years of Education: N/A   Occupational History  . 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 Smoking status: Never Smoker   . Smokeless tobacco: Never Used  . Alcohol Use: Yes     Comment: 2 bottles of wine/week, over 2-3 nights  . Drug Use: No  . Sexual Activity: Not on file   Other Topics Concern  . Not on file   Social History Narrative   Divorced.  Shared custody of her 2 children.  Dog and cat.  Son is at [employer]    Family History  Problem Relation Age of Onset  . Hypertension Father   . Asthma Son   . Cancer Maternal Aunt     melanoma  . Cancer Maternal Aunt 60    colon cancer  . Colon cancer Maternal Aunt 60  . Diabetes Neg Hx     Current outpatient prescriptions:citalopram (CELEXA) 20 MG tablet, TAKE 1 TABLET (20 MG TOTAL) BY MOUTH DAILY., Disp: 30 tablet, Rfl: 11;  valACYclovir (VALTREX) 1000 MG tablet, TAKE 0.5 TABLETS (500 MG TOTAL) BY MOUTH 2 TIMES DAILY AS DIRECTED, AS NEEDED FOR RECURRENT INFECTION., Disp: 30 tablet, Rfl: 5  No Known Allergies  ROS: The patient denies anorexia, fever, weight changes (slight gain), headaches, vision changes, decreased hearing, ear pain, sore throat, breast concerns, chest pain, palpitations, dizziness, syncope, dyspnea on exertion, cough, swelling, nausea, vomiting, diarrhea, constipation, abdominal pain, melena, hematochezia, indigestion/heartburn, hematuria, incontinence, dysuria, irregular menstrual cycles, vaginal discharge, odor or itch, genital lesions, numbness, tingling, weakness, tremor, suspicious skin lesions, depression, anxiety, abnormal bleeding/bruising, or enlarged lymph nodes.  Hip pain resolved with the naproxen previously prescribed   BP 118/76  Pulse 72  Ht 5' 1.5" (1.562 m)  Wt 150 lb (68.04 kg)  BMI 27.89 kg/m2  LMP 10/16/2012  General Appearance:  Alert, cooperative, no distress, appears  stated age   Head:  Normocephalic, without obvious abnormality, atraumatic   Eyes:  PERRL, conjunctiva/corneas clear, EOM's intact, fundi  benign   Ears:  Normal TM's and external ear canals   Nose:  Nares normal, mucosa normal, no drainage or sinus tenderness   Throat:  Lips, mucosa, and tongue normal; teeth and gums normal   Neck:  Supple, no lymphadenopathy; thyroid: no enlargement/tenderness/nodules; no carotid  bruit or JVD   Back:  Spine nontender, no  curvature, ROM normal, no CVA tenderness   Lungs:  Clear to auscultation bilaterally without wheezes, rales or ronchi; respirations unlabored   Chest Wall:  No tenderness or deformity   Heart:  Regular rate and rhythm, S1 and S2 normal, no murmur, rub  or gallop   Breast Exam:  No tenderness, masses, or nipple discharge or inversion. No axillary lymphadenopathy   Abdomen:  Soft, non-tender, nondistended, normoactive bowel sounds,  no masses, no hepatosplenomegaly   Genitalia:  Normal external genitalia without lesions. BUS and vagina normal; cervix without lesions, or cervical motion tenderness. No abnormal vaginal discharge. Uterus and adnexa not enlarged, nontender, no masses. Pap performed   Rectal:  Exam declined by patient.   Extremities:  No clubbing, cyanosis or edema   Pulses:  2+ and symmetric all extremities   Skin:  Skin color, texture, turgor normal, no rashes or lesions   Lymph nodes:  Cervical, supraclavicular, and axillary nodes normal   Neurologic:  CNII-XII intact, normal strength, sensation and gait; reflexes 2+ and symmetric throughout          Psych: Normal mood, affect, hygiene and grooming.    ASSESSMENT/PLAN:  Routine general medical examination at a health care facility - Plan: Visual acuity screening, POCT Urinalysis Dipstick, Lipid panel, Glucose, random, Vit D  25 hydroxy (rtn osteoporosis monitoring), Cytology - PAP [place]  Unspecified vitamin D deficiency - Plan: Vit D  25 hydroxy (rtn osteoporosis monitoring)  Mixed hyperlipidemia - Plan: Lipid panel  Depressive disorder, not elsewhere classified  Discussed monthly self breast exams and yearly mammograms after the age of 20; at least 30 minutes of aerobic activity at least 5 days/week; proper sunscreen use reviewed; healthy diet, including goals of calcium and vitamin D intake and alcohol recommendations (less than or equal to 1 drink/day) reviewed; regular seatbelt use; changing batteries in smoke  detectors. Immunization recommendations discussed--declines flu shot. Colonoscopy recommendations reviewed--age 58. Hemoccult cards given.  Asking about phentermine.  Briefly discussed Qsymia and phentermine--doesn't meet criteria.  Encouraged cutting back on alcohol use as well as caloric intake in general, and to get more regular exercise. Previously successful with [employer]--encouraged her to restart.  Can consider Qsymia in future if still struggling (technically should be BMI>27 with comorbidity, or else >30)  Will refill her valtrex and citalopram if/when needed (shouldn't be due until April 2015.  F/u 1 year, sooner prn.  Encouraged condom use for prevention of STD's

## 2012-11-13 NOTE — Patient Instructions (Signed)

## 2012-11-14 ENCOUNTER — Other Ambulatory Visit: Payer: Self-pay | Admitting: *Deleted

## 2012-11-14 LAB — VITAMIN D 25 HYDROXY (VIT D DEFICIENCY, FRACTURES): Vit D, 25-Hydroxy: 23 ng/mL — ABNORMAL LOW (ref 30–89)

## 2012-11-14 MED ORDER — ERGOCALCIFEROL 1.25 MG (50000 UT) PO CAPS: 50000.0000 [IU] | ORAL_CAPSULE | ORAL | Status: DC

## 2012-11-18 ENCOUNTER — Encounter: Payer: Self-pay | Admitting: Family Medicine

## 2012-11-22 ENCOUNTER — Encounter: Payer: Self-pay | Admitting: Internal Medicine

## 2013-09-14 ENCOUNTER — Other Ambulatory Visit: Payer: Self-pay | Admitting: Family Medicine

## 2013-11-24 ENCOUNTER — Encounter: Payer: Self-pay | Admitting: Family Medicine

## 2013-11-26 ENCOUNTER — Encounter: Payer: Self-pay | Admitting: Family Medicine

## 2013-11-26 ENCOUNTER — Ambulatory Visit (INDEPENDENT_AMBULATORY_CARE_PROVIDER_SITE_OTHER): Payer: BC Managed Care – PPO | Admitting: Family Medicine

## 2013-11-26 VITALS — BP 118/70 | HR 72 | Ht 62.0 in | Wt 154.0 lb

## 2013-11-26 DIAGNOSIS — R5383 Other fatigue: Secondary | ICD-10-CM

## 2013-11-26 DIAGNOSIS — E559 Vitamin D deficiency, unspecified: Secondary | ICD-10-CM

## 2013-11-26 DIAGNOSIS — F324 Major depressive disorder, single episode, in partial remission: Secondary | ICD-10-CM

## 2013-11-26 DIAGNOSIS — Z0000 Encounter for general adult medical examination without abnormal findings: Secondary | ICD-10-CM

## 2013-11-26 DIAGNOSIS — E785 Hyperlipidemia, unspecified: Secondary | ICD-10-CM

## 2013-11-26 DIAGNOSIS — F325 Major depressive disorder, single episode, in full remission: Secondary | ICD-10-CM

## 2013-11-26 LAB — COMPREHENSIVE METABOLIC PANEL
ALBUMIN: 4.6 g/dL (ref 3.5–5.2)
ALT: 21 U/L (ref 0–35)
AST: 19 U/L (ref 0–37)
Alkaline Phosphatase: 63 U/L (ref 39–117)
BUN: 9 mg/dL (ref 6–23)
CO2: 24 mEq/L (ref 19–32)
Calcium: 9.1 mg/dL (ref 8.4–10.5)
Chloride: 101 mEq/L (ref 96–112)
Creat: 0.7 mg/dL (ref 0.50–1.10)
GLUCOSE: 73 mg/dL (ref 70–99)
Potassium: 4 mEq/L (ref 3.5–5.3)
SODIUM: 137 meq/L (ref 135–145)
TOTAL PROTEIN: 7.3 g/dL (ref 6.0–8.3)
Total Bilirubin: 0.6 mg/dL (ref 0.2–1.2)

## 2013-11-26 LAB — POCT URINALYSIS DIPSTICK
Bilirubin, UA: NEGATIVE
Blood, UA: NEGATIVE
GLUCOSE UA: NEGATIVE
Ketones, UA: NEGATIVE
NITRITE UA: NEGATIVE
PH UA: 5
Protein, UA: NEGATIVE
Spec Grav, UA: 1.01
Urobilinogen, UA: NEGATIVE

## 2013-11-26 LAB — LIPID PANEL
Cholesterol: 203 mg/dL — ABNORMAL HIGH (ref 0–200)
HDL: 69 mg/dL (ref >39)
LDL Cholesterol: 106 mg/dL — ABNORMAL HIGH (ref 0–99)
Total CHOL/HDL Ratio: 2.9 Ratio
Triglycerides: 142 mg/dL (ref <150)
VLDL: 28 mg/dL (ref 0–40)

## 2013-11-26 LAB — TSH: TSH: 1.072 u[IU]/mL (ref 0.350–4.500)

## 2013-11-26 LAB — CBC WITH DIFFERENTIAL/PLATELET
Basophils Absolute: 0.1 10*3/uL (ref 0.0–0.1)
Basophils Relative: 1 % (ref 0–1)
Eosinophils Absolute: 0.1 10*3/uL (ref 0.0–0.7)
Eosinophils Relative: 1 % (ref 0–5)
HCT: 41.5 % (ref 36.0–46.0)
Hemoglobin: 13.9 g/dL (ref 12.0–15.0)
LYMPHS ABS: 3 10*3/uL (ref 0.7–4.0)
LYMPHS PCT: 43 % (ref 12–46)
MCH: 30.3 pg (ref 26.0–34.0)
MCHC: 33.5 g/dL (ref 30.0–36.0)
MCV: 90.6 fL (ref 78.0–100.0)
Monocytes Absolute: 0.3 10*3/uL (ref 0.1–1.0)
Monocytes Relative: 5 % (ref 3–12)
NEUTROS PCT: 50 % (ref 43–77)
Neutro Abs: 3.5 10*3/uL (ref 1.7–7.7)
PLATELETS: 354 10*3/uL (ref 150–400)
RBC: 4.58 MIL/uL (ref 3.87–5.11)
RDW: 12.8 % (ref 11.5–15.5)
WBC: 6.9 10*3/uL (ref 4.0–10.5)

## 2013-11-26 MED ORDER — CITALOPRAM HYDROBROMIDE 20 MG PO TABS: ORAL_TABLET | ORAL | Status: DC

## 2013-11-26 NOTE — Progress Notes (Signed)
Chief Complaint  Patient presents with  . Annual Exam    fasting annual exam/med check with pap (doesn't want pap or pelvic). UA showed trace leuks, no symptoms. Does have a small growth on her left leg that she would like you to look at-been there about a month. Pt declined flu vaccine.    Sherry Carter is a 48 y.o. female who presents for a complete physical.  She has the following concerns:  L shin lump--noticed when shaving and it bled.  She believes it has been there for about a month.  Somewhat smaller than when first noticed, might be starting to go away.  No pain, drainage, swelling, redness.  Vitamin D deficiency: She took prescription Vitamin D for 3 months, then was taking multivitamin.  She got out of the habit when she got sick with sinus infection, and hasn't taken it in about 3 months.  Triglycerides were elevated last year.  She admits to not having made any changes in her diet; she is exercising more. She only occasionally has sweet tea.  She doesn't drink juice or soda.  Doesn't each much candy/cookies, although had some chocolate covered fruit yesterday afternoon.  She doesn't eat fried foods, but eats a lot of pasta (with butter, cheese).  Not taking any fish oil.  She drinks 1 bottle of red wine during the week, and a bottle on the weekend (1-2 glasses a couple of days during the week, and a full bottle one day of the weekend; 2 bottles/week).  Depression: Well controlled with the citalopram. She doesn't get as upset about stressful issues with her daughter; coping better. No side effects of medication.  Immunization History  Administered Date(s) Administered  . Tdap 12/01/2010   Refuses flu shots Last Pap smear: 10/2012--normal; no high risk HPV, negative chlamydia Last mammogram: 09/2011 Last colonoscopy: never  Last DEXA: never  Dentist: twice yearly  Ophtho: 2 years ago, needs reading glasses Exercise: Body Pump class at [employer] twice a week. Walks some;  treadmill/elliptical once a week.  Past Medical History  Diagnosis Date  . Herpes labialis     also gets outbreaks of herpes infection on her R thumb  . Depressive disorder, not elsewhere classified   . Pure hypercholesterolemia     elevated TG 2014  . Vitamin D deficiency 10/2012    History reviewed. No pertinent past surgical history.  History   Social History  . Marital Status: Divorced    Spouse Name: N/A    Number of Children: 2  . Years of Education: N/A   Occupational History  . 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 Smoking status: Never Smoker   . Smokeless tobacco: Never Used  . Alcohol Use: 0.0 oz/week    0 Not specified per week     Comment: She drinks 1 bottle of red wine during the week, and a bottle on the weekend (1-2 glasses a couple of days during the week, and a full bottle one day of the weekend; 2 bottles/week).  . Drug Use: No  . Sexual Activity: Not on file   Other Topics Concern  . Not on file   Social History Narrative   Divorced.  Shared custody of her 2 children.  2 dogs and cat.  Son is at [employer]    Family History  Problem Relation Age of Onset  . Hypertension Father   . Asthma Son   . Cancer Maternal Aunt  melanoma  . Cancer Maternal Aunt 60    colon cancer  . Colon cancer Maternal Aunt 60  . Diabetes Neg Hx     Outpatient Encounter Prescriptions as of 11/26/2013  Medication Sig Note  . citalopram (CELEXA) 20 MG tablet TAKE 1 TABLET (20 MG TOTAL) BY MOUTH DAILY   . Multiple Vitamin (MULTIVITAMIN) tablet Take 1 tablet by mouth daily. 11/26/2013: Hasn't taken in about 3 months  . valACYclovir (VALTREX) 1000 MG tablet TAKE 0.5 TABLETS (500 MG TOTAL) BY MOUTH 2 TIMES DAILY AS DIRECTED, AS NEEDED FOR RECURRENT INFECTION. 11/26/2013: Uses prn, not needing  . [DISCONTINUED] ergocalciferol (VITAMIN D2) 50000 UNITS capsule Take 1 capsule (50,000 Units total) by mouth once a week.     No Known  Allergies  ROS: The patient denies anorexia, fever, weight changes (slight gain), headaches, vision changes, decreased hearing, ear pain, sore throat, breast concerns, chest pain, palpitations, dizziness, syncope, dyspnea on exertion, cough, swelling, nausea, vomiting, diarrhea, constipation, abdominal pain, melena, hematochezia, indigestion/heartburn, hematuria, incontinence, dysuria, irregular menstrual cycles, vaginal discharge, odor or itch, genital lesions, numbness, tingling, weakness, tremor, suspicious skin lesions, depression, anxiety, abnormal bleeding/bruising, or enlarged lymph nodes.  +4 pounds in the last year  PHYSICAL EXAM:  BP 118/70 mmHg  Pulse 72  Ht 5\' 2"  (1.575 m)  Wt 154 lb (69.854 kg)  BMI 28.16 kg/m2  LMP 10/27/2013  General Appearance:  Alert, cooperative, no distress, appears stated age   Head:  Normocephalic, without obvious abnormality, atraumatic   Eyes:  PERRL, conjunctiva/corneas clear, EOM's intact, fundi  benign   Ears:  Normal TM's and external ear canals   Nose:  Nares normal, mucosa normal, no drainage or sinus tenderness   Throat:  Lips, mucosa, and tongue normal; teeth and gums normal   Neck:  Supple, no lymphadenopathy; thyroid: no enlargement/tenderness/nodules; no carotid  bruit or JVD   Back:  Spine nontender, no curvature, ROM normal, no CVA tenderness   Lungs:  Clear to auscultation bilaterally without wheezes, rales or ronchi; respirations unlabored   Chest Wall:  No tenderness or deformity   Heart:  Regular rate and rhythm, S1 and S2 normal, no murmur, rub  or gallop   Breast Exam:  No tenderness, masses, or nipple discharge or inversion. No axillary lymphadenopathy. Mildly tender fibroglandular changes noted, L>R (due for menses any day)  Abdomen:  Soft, non-tender, nondistended, normoactive bowel sounds,  no masses, no hepatosplenomegaly   Genitalia:  Normal external genitalia without lesions. BUS and vagina  normal; no cervical motion tenderness. No abnormal vaginal discharge. Uterus and adnexa not enlarged, nontender, no masses. Pap not performed   Rectal:  Exam declined by patient.   Extremities:  No clubbing, cyanosis or edema   Pulses:  2+ and symmetric all extremities   Skin:  Skin color, texture, turgor normal, no rashes or lesions. Small scabbed lesion left upper shin, slight peeling around it.  No crusting, erythema, swelling, warmth.  No significant nodule/mass noted (ie not consistent with dermatofibroma)   Lymph nodes:  Cervical, supraclavicular, and axillary nodes normal   Neurologic:  CNII-XII intact, normal strength, sensation and gait; reflexes 2+ and symmetric throughout    Psych: Normal mood, affect, hygiene and grooming.     ASSESSMENT/PLAN:  Annual physical exam - Plan: POCT Urinalysis Dipstick, Visual acuity screening, Lipid panel, Comprehensive metabolic panel, CBC with Differential, Vit D  25 hydroxy (rtn osteoporosis monitoring), TSH  Vitamin D deficiency - Plan: Vit D  25 hydroxy (rtn osteoporosis monitoring)  Other fatigue - Plan: Comprehensive metabolic panel, CBC with Differential, Vit D  25 hydroxy (rtn osteoporosis monitoring), TSH  Hyperlipidemia - Plan: Lipid panel  Depression, major, in remission - Plan: citalopram (CELEXA) 20 MG tablet   Discussed monthly self breast exams and yearly mammograms--needs to schedule; at least 30 minutes of aerobic activity at least 5 days/week; proper sunscreen use reviewed; healthy diet, including goals of calcium and vitamin D intake and alcohol recommendations (less than or equal to 1 drink/day) reviewed; regular seatbelt use; changing batteries in smoke detectors. Immunization recommendations discussed--declines flu shot. Colonoscopy recommendations reviewed--age 30.   Counseled re: longterm need for vitamin D supplementation, risks of deficiency. Counseled re: alcohol  recommendations. She is wanting to lose weight--discussed cutting back on pasta portions, alcohol, other sources of calories. Encouraged her to get at least 30 minutes of aerobic exercise daily (150-180 min/week, more for weight loss, if desired).  She still has a lot of valtrex left at home.  Will call for refill when needed (runs out, or loses efficacy because old) Depression--doing well.  Has some ongoing stressors, not at a good point in life to suggest tapering off.  We discussed no abrupt stopping of med; if/when stressors are less, can consider cutting dose in 1/2 for at least 1-2 months before considering lowering the dose/tapering further.  Not suggested to cut down dose at this time.  F/u 1 year, sooner prn.

## 2013-11-26 NOTE — Patient Instructions (Addendum)
  HEALTH MAINTENANCE RECOMMENDATIONS:  It is recommended that you get at least 30 minutes of aerobic exercise at least 5 days/week (for weight loss, you may need as much as 60-90 minutes). This can be any activity that gets your heart rate up. This can be divided in 10-15 minute intervals if needed, but try and build up your endurance at least once a week.  Weight bearing exercise is also recommended twice weekly.  Eat a healthy diet with lots of vegetables, fruits and fiber.  "Colorful" foods have a lot of vitamins (ie green vegetables, tomatoes, red peppers, etc).  Limit sweet tea, regular sodas and alcoholic beverages, all of which has a lot of calories and sugar.  Up to 1 alcoholic drink daily may be beneficial for women (unless trying to lose weight, watch sugars).  Drink a lot of water.  Calcium recommendations are 1200-1500 mg daily (1500 mg for postmenopausal women or women without ovaries), and vitamin D 1000 IU daily.  This should be obtained from diet and/or supplements (vitamins), and calcium should not be taken all at once, but in divided doses.  Monthly self breast exams and yearly mammograms for women over the age of 5 is recommended.  Sunscreen of at least SPF 30 should be used on all sun-exposed parts of the skin when outside between the hours of 10 am and 4 pm (not just when at beach or pool, but even with exercise, golf, tennis, and yard work!)  Use a sunscreen that says "broad spectrum" so it covers both UVA and UVB rays, and make sure to reapply every 1-2 hours.  Remember to change the batteries in your smoke detectors when changing your clock times in the spring and fall.  Use your seat belt every time you are in a car, and please drive safely and not be distracted with cell phones and texting while driving.  Please schedule your mammogram. Colonoscopy at age 40. Consider yearly flu shots (definitely recommended after age 109!)

## 2013-11-27 ENCOUNTER — Other Ambulatory Visit: Payer: Self-pay | Admitting: *Deleted

## 2013-11-27 LAB — VITAMIN D 25 HYDROXY (VIT D DEFICIENCY, FRACTURES): VIT D 25 HYDROXY: 25 ng/mL — AB (ref 30–89)

## 2013-11-27 MED ORDER — ERGOCALCIFEROL 1.25 MG (50000 UT) PO CAPS: 50000.0000 [IU] | ORAL_CAPSULE | ORAL | Status: DC

## 2014-08-18 ENCOUNTER — Other Ambulatory Visit: Payer: Self-pay

## 2014-08-18 DIAGNOSIS — Z1231 Encounter for screening mammogram for malignant neoplasm of breast: Secondary | ICD-10-CM

## 2014-08-27 ENCOUNTER — Ambulatory Visit
Admission: RE | Admit: 2014-08-27 | Discharge: 2014-08-27 | Disposition: A | Discharge status: Home / Self Care | Payer: BLUE CROSS/BLUE SHIELD | Source: Ambulatory Visit

## 2014-08-27 DIAGNOSIS — Z1231 Encounter for screening mammogram for malignant neoplasm of breast: Secondary | ICD-10-CM

## 2014-10-12 ENCOUNTER — Ambulatory Visit (INDEPENDENT_AMBULATORY_CARE_PROVIDER_SITE_OTHER): Payer: BLUE CROSS/BLUE SHIELD | Admitting: Family Medicine

## 2014-10-12 ENCOUNTER — Encounter: Payer: Self-pay | Admitting: Family Medicine

## 2014-10-12 VITALS — BP 126/70 | HR 74 | Temp 97.9°F | Resp 12 | Ht 62.0 in | Wt 156.0 lb

## 2014-10-12 DIAGNOSIS — L237 Allergic contact dermatitis due to plants, except food: Secondary | ICD-10-CM

## 2014-10-12 DIAGNOSIS — R21 Rash and other nonspecific skin eruption: Secondary | ICD-10-CM | POA: Diagnosis not present

## 2014-10-12 MED ORDER — PREDNISONE 5 MG (48) PO TBPK: ORAL_TABLET | ORAL | Status: DC

## 2014-10-12 NOTE — Patient Instructions (Signed)
Start the steroid pack. Follow the directions that come with it. You may continue to use the cetirizine if needed for itching and rash.  Pepcid probably won't be necessary once the steroids are started, but would be fine to take along with it, if it doesn't seem to be improving. Truly, the rash on the chest is hard to differentiate from an allergic reaction to a medication vs from systemic poison ivy allergy. The steroids should help regardless--the question is whether or not you will have a problem with a medication in the future (ie if allergic to levofloxacin).  You may use the topical triamcinolone to the areas of rash, if needed for itching (vs hydrocortisone if the rash is improving). The prescription ointment is stronger, and shouldn't be used on fairly normal skin that itches, use it on the bad rash areas.  Hydrocortisone can be used pretty freely on any itchy area.  Your lungs sound completely clear.  Continue the steroid inhaler until you feel completely better. You don't need to use the albuterol (unless you start getting short of breath, tight/wheezy).  If your prescription is well over $25, let us know--we have prescription savings card for a branded steroid pack that is $25

## 2014-10-12 NOTE — Progress Notes (Signed)
Chief Complaint  Patient presents with  . rash on left leg    started on 09/01. starting ot pop up in places where she was not initilly exposed   She was exposed to a large, mature vine of poison ivy on the side of her house.  She axed it, while wearing gloves, pulled most of it down.  2-3 days later she started having some itchy areas on her wrists, then developed a bad poison ivy rash with welts on her forearms, and both legs, slight area on her neck.  It was blistering and itchy, like classic poison ivy.  She went to Urgent Care on 9/6, when she also had upper respiratory symptoms.  They put her on z-pak, prednisone and albuterol (she was wheezing).  Prednisone was a 6 day taper. She went back to UC on 9/13 for ongoing symptoms--nothing was better.  She had chest x-ray which was reportedly normal. They treated her with an injection (unknown what type of medication), along with a prescription for levaquin, and a steroid inhaler.  Respiratory-wise, she is improving. On 9/15.she developed a rash on her breasts.  It started splotchy, but over the weekend it started spreading more--across her stomach, and on her legs. She is concerned that it could be systemic poison ivy.  She took a pepcid and zyrtec this morning, and the itching is less, and rash on stomach is slightly less noticeable. She doesn't think it is a reaction to any of the medications.  She feels thirsty but not voiding more frequently.  [facility], [facility] reviewed.  Outpatient Encounter Prescriptions as of 10/12/2014  Medication Sig Note  . citalopram (CELEXA) 20 MG tablet TAKE 1 TABLET (20 MG TOTAL) BY MOUTH DAILY   . Multiple Vitamin (MULTIVITAMIN) tablet Take 1 tablet by mouth daily. 11/26/2013: Hasn't taken in about 3 months  . predniSONE (STERAPRED UNI-PAK 48 TAB) 5 MG (48) TBPK tablet Take as directed   . valACYclovir (VALTREX) 1000 MG tablet TAKE 0.5 TABLETS (500 MG TOTAL) BY MOUTH 2 TIMES DAILY AS DIRECTED, AS NEEDED FOR RECURRENT  INFECTION. 11/26/2013: Uses prn, not needing  . [DISCONTINUED] ergocalciferol (DRISDOL) 50000 UNITS capsule Take 1 capsule (50,000 Units total) by mouth once a week.    No facility-administered encounter medications on file as of 10/12/2014.  (prednisone rx was from today, not prior to visit).  No Known Allergies  ROS:  No fevers, headaches, dizziness.  Respiratory symptoms are improving, no shortness of breath, chest pain. No nausea, vomiting. +increased thirst, no polyuria. No bleeding, bruising or other complaints, except as per HPI.  PHYSICAL EXAM:  BP 126/70 mmHg  Pulse 74  Temp(Src) 97.9 F (36.6 C) (Oral)  Resp 12  Ht 5\' 2"  (1.575 m)  Wt 156 lb (70.761 kg)  BMI 28.53 kg/m2  Well developed, pleasant female in no distress HEENT: PERRL, EOMI, conjunctiva clear.  Neck: no lymphadenopathy or mass Heart: regular rate and rhythm Lungs: clear bilaterally.  Good air movement. No wheezes, rales, ronchi Extremities: no edema Skin: hyperpigmented and violaceous areas of healing poison ivy on her forearms and legs. A few excoriated areas, without crusting or infection.   Her upper chest has a diffuse, erythematous, slightly raised rash, nonvesicular. Only very faint raised bumps on her abdomen.  ASSESSMENT/PLAN:  Poison ivy dermatitis - Plan: predniSONE (STERAPRED UNI-PAK 48 TAB) 5 MG (48) TBPK tablet  Rash  Respiratory illness--resolving with current treatment. Continue the steroid inhaler until symptoms completely resolve, then okay to d/c.  Poison  ivy--this has been treated with steroids, with improvement of initial rash. May have systemic reaction, but given appearance of the rash, and the many medications she has taken, cannot exclude allergic reaction to medication. Treat with steroids--given ongoing issues, will treat with a 12 day course (I think initial treatment course was too short, extending the course of her illness).  Risks/benefits/side effects of steroids reviewed. Can  continue zyrtec/antihistamines.  Given lack of anaphylactic symptoms/hives, likely doesn't need to continue with the pepcid.  May continue with the topical steroids as needed for itching.  F/u if symptoms persist/worsen

## 2014-12-02 ENCOUNTER — Encounter: Payer: BC Managed Care – PPO | Admitting: Family Medicine

## 2015-04-12 ENCOUNTER — Encounter: Payer: Self-pay | Admitting: Family Medicine

## 2015-04-12 ENCOUNTER — Ambulatory Visit (INDEPENDENT_AMBULATORY_CARE_PROVIDER_SITE_OTHER): Payer: BLUE CROSS/BLUE SHIELD | Admitting: Family Medicine

## 2015-04-12 VITALS — BP 114/74 | HR 72 | Ht 61.75 in | Wt 159.2 lb

## 2015-04-12 DIAGNOSIS — F325 Major depressive disorder, single episode, in full remission: Secondary | ICD-10-CM | POA: Diagnosis not present

## 2015-04-12 DIAGNOSIS — Z1211 Encounter for screening for malignant neoplasm of colon: Secondary | ICD-10-CM | POA: Diagnosis not present

## 2015-04-12 DIAGNOSIS — E559 Vitamin D deficiency, unspecified: Secondary | ICD-10-CM

## 2015-04-12 DIAGNOSIS — Z0000 Encounter for general adult medical examination without abnormal findings: Secondary | ICD-10-CM

## 2015-04-12 DIAGNOSIS — L659 Nonscarring hair loss, unspecified: Secondary | ICD-10-CM

## 2015-04-12 LAB — POCT URINALYSIS DIPSTICK
BILIRUBIN UA: NEGATIVE
GLUCOSE UA: NEGATIVE
Ketones, UA: NEGATIVE
Nitrite, UA: NEGATIVE
PH UA: 7
Protein, UA: NEGATIVE
RBC UA: NEGATIVE
Spec Grav, UA: 1.02
UROBILINOGEN UA: NEGATIVE

## 2015-04-12 LAB — TSH: TSH: 1.31 m[IU]/L

## 2015-04-12 MED ORDER — CITALOPRAM HYDROBROMIDE 20 MG PO TABS: ORAL_TABLET | ORAL | Status: DC

## 2015-04-12 NOTE — Progress Notes (Signed)
Chief Complaint  Patient presents with  . Annual Exam    fasting annual exam with pap, last pap 11/13/12. UA showed 1+ leuks but no symptoms.  Did not do eye exam as she just went to [facility]. Hair has been having increased hair loss over the last 6 months. Hot flashes mostly at night that have become more frequently than in the past. Place on her left ankle she would like you to look at.    Sherry Carter is a 50 y.o. female who presents for a complete physical.  She has the following concerns:  Hair loss x 6 months--sees a lot in the shower, and notices her ponytail is thinner. She denies significant fatigue, weight/bowel/skin changes. Denies recent changes in diet. +recent stressors (moved last month).  Vitamin D deficiency: She took prescription Vitamin D for 3 months twice in the past.  She is compliant with taking a multivitamin daily and due for recheck.  H/o elevated triiglycerides 2 years ago. Repeat last year was improved. She continues to try and follow a lowfat diet. Lab Results  Component Value Date   CHOL 203* 11/26/2013   HDL 69 11/26/2013   LDLCALC 106* 11/26/2013   TRIG 142 11/26/2013   CHOLHDL 2.9 11/26/2013   Depression: Well controlled with the citalopram. She doesn't get as upset about stressful things, coping better. No side effects of medication.   She moved last week (townhouse in [facility] [place], downsized, no longer has yardwork).   Immunization History  Administered Date(s) Administered  . Tdap 12/01/2010   Refuses flu shots Last Pap smear: 10/2012--normal; no high risk HPV, negative chlamydia Last mammogram: 08/2014 Last colonoscopy: never  Last DEXA: never  Dentist: twice yearly  Ophtho: recent Exercise: [employer]-- 30 min of treadmill on incline and 30 mins weights 3x/week.  Past Medical History  Diagnosis Date  . Herpes labialis     also gets outbreaks of herpes infection on her R thumb  . Depressive disorder, not elsewhere  classified   . Pure hypercholesterolemia     elevated TG 2014  . Vitamin D deficiency 10/2012    History reviewed. No pertinent past surgical history.  Social History   Social History  . Marital Status: Divorced    Spouse Name: N/A  . Number of Children: 2  . Years of Education: N/A   Occupational History  . 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 Smoking status: Never Smoker   . Smokeless tobacco: Never Used  . Alcohol Use: 0.0 oz/week    0 Standard drinks or equivalent per week     Comment: She drinks 1 bottle of white wine during the week, and a bottle on the weekend (1-2 glasses a couple of days during the week, and a full bottle one day of the weekend; 2 bottles/week).  . Drug Use: No  . Sexual Activity: Not Currently   Other Topics Concern  . Not on file   Social History Narrative   Divorced.  Shared custody of her 2 children.  2 dogs and cat.  Son is at [employer]    Family History  Problem Relation Age of Onset  . Hypertension Father   . Asthma Son   . Cancer Maternal Aunt     melanoma  . Cancer Maternal Aunt 60    colon cancer  . Colon cancer Maternal Aunt 68  . Diabetes Neg Hx     Outpatient Encounter Prescriptions as of  04/12/2015  Medication Sig Note  . citalopram (CELEXA) 20 MG tablet TAKE 1 TABLET (20 MG TOTAL) BY MOUTH DAILY   . Multiple Vitamin (MULTIVITAMIN) tablet Take 1 tablet by mouth daily. 04/12/2015: daily  . [DISCONTINUED] citalopram (CELEXA) 20 MG tablet TAKE 1 TABLET (20 MG TOTAL) BY MOUTH DAILY   . valACYclovir (VALTREX) 1000 MG tablet TAKE 0.5 TABLETS (500 MG TOTAL) BY MOUTH 2 TIMES DAILY AS DIRECTED, AS NEEDED FOR RECURRENT INFECTION. (Patient not taking: Reported on 04/12/2015) 11/26/2013: Uses prn, not needing  . [DISCONTINUED] predniSONE (STERAPRED UNI-PAK 48 TAB) 5 MG (48) TBPK tablet Take as directed    No facility-administered encounter medications on file as of 04/12/2015.    No Known  Allergies   ROS: The patient denies anorexia, fever, weight changes (slight gain), headaches, vision changes, decreased hearing, ear pain, sore throat, breast concerns, chest pain, palpitations, dizziness, syncope, dyspnea on exertion, cough, swelling, nausea, vomiting, diarrhea, constipation, abdominal pain, melena, hematochezia, indigestion/heartburn, hematuria, incontinence, dysuria, irregular menstrual cycles, vaginal discharge, odor or itch, genital lesions, numbness, tingling, weakness, tremor, suspicious skin lesions, depression, anxiety, abnormal bleeding/bruising, or enlarged lymph nodes.  +hot flashes, night sweats  PHYSICAL EXAM:  BP 114/74 mmHg  Pulse 72  Ht 5' 1.75" (1.568 m)  Wt 159 lb 3.2 oz (72.213 kg)  BMI 29.37 kg/m2  LMP 03/17/2015   General Appearance:  Alert, cooperative, no distress, appears stated age   Head:  Normocephalic, without obvious abnormality, atraumatic   Eyes:  PERRL, conjunctiva/corneas clear, EOM's intact, fundi  benign   Ears:  Normal TM's and external ear canals   Nose:  Nares normal, mucosa normal, no drainage or sinus tenderness   Throat:  Lips, mucosa, and tongue normal; teeth and gums normal   Neck:  Supple, no lymphadenopathy; thyroid: no enlargement/tenderness/nodules; no carotid  bruit or JVD   Back:  Spine nontender, no curvature, ROM normal, no CVA tenderness   Lungs:  Clear to auscultation bilaterally without wheezes, rales or ronchi; respirations unlabored   Chest Wall:  No tenderness or deformity   Heart:  Regular rate and rhythm, S1 and S2 normal, no murmur, rub  or gallop   Breast Exam:  No masses, nipple discharge or inversion. No axillary lymphadenopathy. Mildly tender fibroglandular changes noted, bilaterally in upper outer quadrants. No dominant or discrete mass  Abdomen:  Soft, non-tender, nondistended, normoactive bowel sounds,  no masses, no hepatosplenomegaly    Genitalia:  Normal external genitalia without lesions. BUS and vagina normal; no cervical motion tenderness. No abnormal vaginal discharge. Uterus and adnexa not enlarged, nontender, no masses. Pap not performed   Rectal:  Normal sphincter tone. No mass. Heme negative brown stool  Extremities:  No clubbing, cyanosis or edema   Pulses:  2+ and symmetric all extremities   Skin:  Skin color, texture, turgor normal, no rashes. Excoriated area on her right lateral shin. Left medial ankle, about 1.5 inches above the malleolus there is a slightly scabbed/hyperkeratotic area, measuring 2-2.5 mm in size. Feels superficial, no deep nodule, nontender; no crusting/weeping, erythema  Lymph nodes:  Cervical, supraclavicular, and axillary nodes normal   Neurologic:  CNII-XII intact, normal strength, sensation and gait; reflexes 2+ and symmetric throughout    Psych:   Normal mood, affect, hygiene and grooming         ASSESSMENT/PLAN:  Annual physical exam - Plan: POCT Urinalysis Dipstick  Depression, major, in remission ([place]) - Plan: citalopram (CELEXA) 20 MG tablet  Vitamin D deficiency - Plan: VITAMIN  D 25 Hydroxy (Vit-D Deficiency, Fractures)  Hair loss - Plan: TSH  Colon cancer screening - Plan: Ambulatory referral to Gastroenterology   Hair loss-- Consider taking hair/skin/nail vitamin. Continue to eat a healthy diet. We are checking your thyroid.  Likely is hormonal, with possible stress contributing.   Discussed monthly self breast exams and yearly mammograms; at least 30 minutes of aerobic activity at least 5 days/week, weight bearing exercise at least 2x/week; proper sunscreen use reviewed; healthy diet, including goals of calcium and vitamin D intake and alcohol recommendations (less than or equal to 1 drink/day) reviewed; regular seatbelt use; changing batteries in smoke detectors. Immunization recommendations discussed--declines flu shot.  Colonoscopy recommendations reviewed--refer for colonoscopy, prefers female, refer to Dr Collene Mares No pap this year (<3 years currently)  F/u 1 year, sooner prn

## 2015-04-12 NOTE — Patient Instructions (Addendum)
  HEALTH MAINTENANCE RECOMMENDATIONS:  It is recommended that you get at least 30 minutes of aerobic exercise at least 5 days/week (for weight loss, you may need as much as 60-90 minutes). This can be any activity that gets your heart rate up. This can be divided in 10-15 minute intervals if needed, but try and build up your endurance at least once a week.  Weight bearing exercise is also recommended twice weekly.  Eat a healthy diet with lots of vegetables, fruits and fiber.  "Colorful" foods have a lot of vitamins (ie green vegetables, tomatoes, red peppers, etc).  Limit sweet tea, regular sodas and alcoholic beverages, all of which has a lot of calories and sugar.  Up to 1 alcoholic drink daily may be beneficial for women (unless trying to lose weight, watch sugars).  Drink a lot of water.  Calcium recommendations are 1200-1500 mg daily (1500 mg for postmenopausal women or women without ovaries), and vitamin D 1000 IU daily.  This should be obtained from diet and/or supplements (vitamins), and calcium should not be taken all at once, but in divided doses.  Monthly self breast exams and yearly mammograms for women over the age of 12 is recommended.  Sunscreen of at least SPF 30 should be used on all sun-exposed parts of the skin when outside between the hours of 10 am and 4 pm (not just when at beach or pool, but even with exercise, golf, tennis, and yard work!)  Use a sunscreen that says "broad spectrum" so it covers both UVA and UVB rays, and make sure to reapply every 1-2 hours.  Remember to change the batteries in your smoke detectors when changing your clock times in the spring and fall.  Use your seat belt every time you are in a car, and please drive safely and not be distracted with cell phones and texting while driving.  Consider taking hair/skin/nail vitamin. Continue to eat a healthy diet. We are checking your thyroid.  Likely is hormonal, with possible stress contributing.   Consider  a trial of Estroven (black cohosh, soy)   We are referring you to Dr. Nelwyn Salisbury for colonoscopy

## 2015-04-13 LAB — VITAMIN D 25 HYDROXY (VIT D DEFICIENCY, FRACTURES): Vit D, 25-Hydroxy: 17 ng/mL — ABNORMAL LOW (ref 30–100)

## 2015-04-14 ENCOUNTER — Other Ambulatory Visit: Payer: Self-pay | Admitting: *Deleted

## 2015-04-14 MED ORDER — VITAMIN D (ERGOCALCIFEROL) 1.25 MG (50000 UNIT) PO CAPS: 50000.0000 [IU] | ORAL_CAPSULE | ORAL | Status: DC

## 2015-04-15 ENCOUNTER — Other Ambulatory Visit: Payer: Self-pay | Admitting: *Deleted

## 2015-04-15 DIAGNOSIS — Z1211 Encounter for screening for malignant neoplasm of colon: Secondary | ICD-10-CM

## 2015-04-16 ENCOUNTER — Encounter: Payer: Self-pay | Admitting: Gastroenterology

## 2015-05-24 DIAGNOSIS — K648 Other hemorrhoids: Secondary | ICD-10-CM

## 2015-05-24 DIAGNOSIS — K5790 Diverticulosis of intestine, part unspecified, without perforation or abscess without bleeding: Secondary | ICD-10-CM

## 2015-05-24 DIAGNOSIS — K635 Polyp of colon: Secondary | ICD-10-CM

## 2015-05-24 HISTORY — DX: Diverticulosis of intestine, part unspecified, without perforation or abscess without bleeding: K57.90

## 2015-05-24 HISTORY — DX: Polyp of colon: K63.5

## 2015-05-24 HISTORY — DX: Other hemorrhoids: K64.8

## 2015-05-28 ENCOUNTER — Encounter: Payer: Self-pay | Admitting: Gastroenterology

## 2015-05-28 ENCOUNTER — Ambulatory Visit (AMBULATORY_SURGERY_CENTER): Payer: Self-pay | Admitting: *Deleted

## 2015-05-28 VITALS — Ht 62.0 in | Wt 152.0 lb

## 2015-05-28 DIAGNOSIS — Z1211 Encounter for screening for malignant neoplasm of colon: Secondary | ICD-10-CM

## 2015-05-28 MED ORDER — NA SULFATE-K SULFATE-MG SULF 17.5-3.13-1.6 GM/177ML PO SOLN: 1.0000 | Freq: Once | ORAL | Status: DC

## 2015-05-28 NOTE — Progress Notes (Signed)
No egg or soy allergy known to patient  No issues with past sedation with any surgeries  or procedures, no intubation in the past   No diet pills per patient No home 02 use per patient  No blood thinners per patient  Pt denies issues with constipation  emmi video to e mail   [email or web address]

## 2015-06-09 ENCOUNTER — Telehealth: Payer: Self-pay | Admitting: Gastroenterology

## 2015-06-09 NOTE — Telephone Encounter (Signed)
Pt said her Ins. Need Prior Auth for the Prep.   Wants to know if we have a sample or something that is covered by her Ins.

## 2015-06-09 NOTE — Telephone Encounter (Signed)
Advised pt to pick up prep at 4th floor front desk. Receptionist will have a sample prep with her name on it. Sherry Carter PV

## 2015-06-11 ENCOUNTER — Encounter: Payer: Self-pay | Admitting: Gastroenterology

## 2015-06-11 ENCOUNTER — Ambulatory Visit (AMBULATORY_SURGERY_CENTER): Payer: BLUE CROSS/BLUE SHIELD | Admitting: Gastroenterology

## 2015-06-11 ENCOUNTER — Telehealth: Payer: Self-pay

## 2015-06-11 VITALS — BP 102/81 | HR 67 | Temp 99.3°F | Resp 20 | Ht 61.0 in | Wt 159.0 lb

## 2015-06-11 DIAGNOSIS — D123 Benign neoplasm of transverse colon: Secondary | ICD-10-CM

## 2015-06-11 DIAGNOSIS — D122 Benign neoplasm of ascending colon: Secondary | ICD-10-CM | POA: Diagnosis not present

## 2015-06-11 DIAGNOSIS — D129 Benign neoplasm of anus and anal canal: Secondary | ICD-10-CM

## 2015-06-11 DIAGNOSIS — D125 Benign neoplasm of sigmoid colon: Secondary | ICD-10-CM

## 2015-06-11 DIAGNOSIS — Z1211 Encounter for screening for malignant neoplasm of colon: Secondary | ICD-10-CM | POA: Diagnosis present

## 2015-06-11 DIAGNOSIS — D128 Benign neoplasm of rectum: Secondary | ICD-10-CM

## 2015-06-11 MED ORDER — SODIUM CHLORIDE 0.9 % IV SOLN: 500.0000 mL | INTRAVENOUS | Status: DC

## 2015-06-11 NOTE — Progress Notes (Signed)
To pacu vss patent aw repor to rn

## 2015-06-11 NOTE — Telephone Encounter (Signed)
Spoke with pt- appt scheduled for consult to discuss BC options for 06/14/15. Pt agreeable. Victorino December

## 2015-06-11 NOTE — Telephone Encounter (Signed)
She will need an appointment for this.

## 2015-06-11 NOTE — Op Note (Signed)
[facility] Patient Name: Sherry Carter Procedure Date: 06/11/2015 7:45 AM MRN: [id number] Endoscopist: Remo Lipps P. Havery Moros , MD Age: 50 Referring MD:  Date of Birth: 04-13-1965 Gender: Female Procedure:                Colonoscopy Indications:              Screening for malignant neoplasm in the colon, This                            is the patient's first colonoscopy Medicines:                Monitored Anesthesia Care Procedure:                Pre-Anesthesia Assessment:                           - Prior to the procedure, a History and Physical                            was performed, and patient medications and                            allergies were reviewed. The patient's tolerance of                            previous anesthesia was also reviewed. The risks                            and benefits of the procedure and the sedation                            options and risks were discussed with the patient.                            All questions were answered, and informed consent                            was obtained. Prior Anticoagulants: The patient has                            taken no previous anticoagulant or antiplatelet                            agents. ASA Grade Assessment: II - A patient with                            mild systemic disease. After reviewing the risks                            and benefits, the patient was deemed in                            satisfactory condition to undergo the procedure.  After obtaining informed consent, the colonoscope                            was passed under direct vision. Throughout the                            procedure, the patient's blood pressure, pulse, and                            oxygen saturations were monitored continuously. The                            Model PCF-H190L [id number]) scope was introduced                            through the anus and advanced to the the  cecum,                            identified by appendiceal orifice and ileocecal                            valve. The colonoscopy was performed without                            difficulty. The patient tolerated the procedure                            well. The quality of the bowel preparation was                            adequate. The ileocecal valve, appendiceal orifice,                            and rectum were photographed. Scope In: 8:01:56 AM Scope Out: 8:25:21 AM Scope Withdrawal Time: 0 hours 20 minutes 30 seconds  Total Procedure Duration: 0 hours 23 minutes 25 seconds  Findings:                 The perianal and digital rectal examinations were                            normal.                           Two sessile polyps were found in the ascending                            colon. The polyps were 3 mm in size. These polyps                            were removed with a cold biopsy forceps. Resection                            and retrieval were complete.  A 6 mm polyp was found in the transverse colon. The                            polyp was sessile. The polyp was removed with a                            cold snare. Resection and retrieval were complete.                           Two sessile polyps were found in the sigmoid colon.                            The polyps were 4 mm in size. These polyps were                            removed with a cold snare. Resection and retrieval                            were complete.                           A 3 mm polyp was found in the rectum. The polyp was                            sessile. The polyp was removed with a cold biopsy                            forceps. Resection and retrieval were complete.                           A few medium-mouthed diverticula were found in the                            left colon.                           Non-bleeding internal hemorrhoids were found during                             retroflexion. The hemorrhoids were small.                           The exam was otherwise without abnormality. The                            patient did not retain air well in the left colon,                            which prolonged the procedure. Complications:            No immediate complications. Estimated blood loss:                            Minimal. Estimated  Blood Loss:     Estimated blood loss was minimal. Impression:               - Two 3 mm polyps in the ascending colon, removed                            with a cold biopsy forceps. Resected and retrieved.                           - One 6 mm polyp in the transverse colon, removed                            with a cold snare. Resected and retrieved.                           - Two 4 mm polyps in the sigmoid colon, removed                            with a cold snare. Resected and retrieved.                           - One 3 mm polyp in the rectum, removed with a cold                            biopsy forceps. Resected and retrieved.                           - Diverticulosis in the left colon.                           - Non-bleeding internal hemorrhoids.                           - The examination was otherwise normal. Recommendation:           - Patient has a contact number available for                            emergencies. The signs and symptoms of potential                            delayed complications were discussed with the                            patient. Return to normal activities tomorrow.                            Written discharge instructions were provided to the                            patient.                           - Resume previous diet.                           -  Continue present medications.                           - No aspirin, ibuprofen, naproxen, or other                            non-steroidal anti-inflammatory drugs for 2 weeks                             after polyp removal.                           - Await pathology results.                           - Repeat colonoscopy is recommended for                            surveillance. The colonoscopy date will be                            determined after pathology results from today's                            exam become available for review. Remo Lipps P. Armbruster, MD 06/11/2015 8:30:57 AM This report has been signed electronically.

## 2015-06-11 NOTE — Patient Instructions (Signed)
YOU HAD AN ENDOSCOPIC PROCEDURE TODAY AT [facility]:   Refer to the procedure report that was given to you for any specific questions about what was found during the examination.  If the procedure report does not answer your questions, please call your gastroenterologist to clarify.  If you requested that your care partner not be given the details of your procedure findings, then the procedure report has been included in a sealed envelope for you to review at your convenience later.  YOU SHOULD EXPECT: Some feelings of bloating in the abdomen. Passage of more gas than usual.  Walking can help get rid of the air that was put into your GI tract during the procedure and reduce the bloating. If you had a lower endoscopy (such as a colonoscopy or flexible sigmoidoscopy) you may notice spotting of blood in your stool or on the toilet paper. If you underwent a bowel prep for your procedure, you may not have a normal bowel movement for a few days.  Please Note:  You might notice some irritation and congestion in your nose or some drainage.  This is from the oxygen used during your procedure.  There is no need for concern and it should clear up in a day or so.  SYMPTOMS TO REPORT IMMEDIATELY:   Following lower endoscopy (colonoscopy or flexible sigmoidoscopy):  Excessive amounts of blood in the stool  Significant tenderness or worsening of abdominal pains  Swelling of the abdomen that is new, acute  Fever of 100F or higher   For urgent or emergent issues, a gastroenterologist can be reached at any hour by calling [phone].   DIET: Your first meal following the procedure should be a small meal and then it is ok to progress to your normal diet. Heavy or fried foods are harder to digest and may make you feel nauseous or bloated.  Likewise, meals heavy in dairy and vegetables can increase bloating.  Drink plenty of fluids but you should avoid alcoholic beverages for 24 hours. Increase  the fiber in your diet, and drink plenty of water.  ACTIVITY:  You should plan to take it easy for the rest of today and you should NOT DRIVE or use heavy machinery until tomorrow (because of the sedation medicines used during the test).    FOLLOW UP: Our staff will call the number listed on your records the next business day following your procedure to check on you and address any questions or concerns that you may have regarding the information given to you following your procedure. If we do not reach you, we will leave a message.  However, if you are feeling well and you are not experiencing any problems, there is no need to return our call.  We will assume that you have returned to your regular daily activities without incident.  If any biopsies were taken you will be contacted by phone or by letter within the next 1-3 weeks.  Please call us at [phone] if you have not heard about the biopsies in 3 weeks.    SIGNATURES/CONFIDENTIALITY: You and/or your care partner have signed paperwork which will be entered into your electronic medical record.  These signatures attest to the fact that that the information above on your After Visit Summary has been reviewed and is understood.  Full responsibility of the confidentiality of this discharge information lies with you and/or your care-partner.  No NSAIDS for two weeks ie: Aspirin, Aleve and Ibuprofen per Dr. Loni Muse.  Read all of the handouts given to you by your recovery room nurse.  Thank-you for choosing [place] for your healthcare needs today.

## 2015-06-11 NOTE — Telephone Encounter (Signed)
Pt called in requesting the depo-provera shot for birth control. Pt states she has a date on Monday and would like to get the shot today since she is off of work. Advised her that Dr. Tomi Bamberger is not in the office today and we would need an order. Told her I would double check. She can be reached at [phone]. Sherry Carter

## 2015-06-11 NOTE — Progress Notes (Signed)
Called to room to assist during endoscopic procedure.  Patient ID and intended procedure confirmed with present staff. Received instructions for my participation in the procedure from the performing physician.  

## 2015-06-14 ENCOUNTER — Institutional Professional Consult (permissible substitution): Payer: Self-pay | Admitting: Family Medicine

## 2015-06-14 ENCOUNTER — Telehealth: Payer: Self-pay | Admitting: *Deleted

## 2015-06-14 NOTE — Telephone Encounter (Signed)
  Follow up Call-  Call back number 06/11/2015  Post procedure Call Back phone  # [phone]  Permission to leave phone message Yes     Patient questions:  Do you have a fever, pain , or abdominal swelling? No. Pain Score  0 *  Have you tolerated food without any problems? Yes.    Have you been able to return to your normal activities? Yes.    Do you have any questions about your discharge instructions: Diet   No. Medications  No. Follow up visit  No.  Do you have questions or concerns about your Care? No.  Actions: * If pain score is 4 or above: No action needed, pain <4.

## 2015-06-16 ENCOUNTER — Encounter: Payer: Self-pay | Admitting: Gastroenterology

## 2015-06-17 ENCOUNTER — Encounter: Payer: Self-pay | Admitting: Family Medicine

## 2017-03-06 ENCOUNTER — Encounter: Payer: Self-pay | Admitting: Family Medicine

## 2017-03-06 NOTE — Progress Notes (Signed)
Chief Complaint  Patient presents with  . Annual Exam    fasting (blood in labs) annual exam with pap. Had eyes examined at [facility]. No concerns.     Sherry Carter is a 52 y.o. female who presents for a complete physical.    Vitamin D deficiency: Last check was 03/2015 and level was low at 17.  She has had low levels and taken prescription Vitamin D for 3 months multiple times in the past.  She is currently taking 1000 IU of Vitamin D, plus Ca with D and MVI daily.  H/o elevated triiglycerides 2 years ago. Repeat was improved. Last check was in 2015. She continues to try and follow a lowfat diet. Lab Results  Component Value Date   CHOL 203 (H) 11/26/2013   HDL 69 11/26/2013   LDLCALC 106 (H) 11/26/2013   TRIG 142 11/26/2013   CHOLHDL 2.9 11/26/2013    Depression: She has been off of citalopram for 1.5-2 years.  No recurrent depression or significant anxiety. Feels like she continues to cope with things well.  Stressors include her children Son--5th year at [employer], not likely going to graduate, not going to classes.  Hangs at dad's house with girlfriend (who dropped out of [employer]), smoke pot Daughter-borderline personality disorder, drug use   Immunization History  Administered Date(s) Administered  . Tdap 12/01/2010   Refuses flu shots Last Pap smear: 10/2012--normal; no high risk HPV, negative chlamydia Monogamous relationship; no condoms.  New partner since 11/2016 Last mammogram: 08/2014 Last colonoscopy: 05/2015, Dr. Havery Moros; multiple polyps (adenomatous, and a hyperplastic polyp); internal hemorrhoids.  3 yr f/u recommended Last DEXA: never  Dentist: twice yearly  Ophtho: every other year, recent Exercise: Yoga 4-5x/week. Walks dog 15 minutes 4x/week.  Past Medical History:  Diagnosis Date  . Allergy   . Colon polyps 05/2015   (tubular adenomas, and hyperplastic)  . Depressive disorder, not elsewhere classified    situational depression  .  Diverticulosis 05/2015   seen on colonoscopy  . Heart murmur    younger dx'd , not always heard   . Herpes labialis    also gets outbreaks of herpes infection on her R thumb  . Internal hemorrhoids 05/2015   seen on colonoscopy  . Pure hypercholesterolemia    elevated TG 2014  . Vitamin D deficiency 10/2012    Past Surgical History:  Procedure Laterality Date  . WISDOM TOOTH EXTRACTION      Social History   Socioeconomic History  . Marital status: Divorced    Spouse name: Not on file  . Number of children: 2  . Years of education: Not on file  . Highest education level: Not on file  Social Needs  . Financial resource strain: Not on file  . Food insecurity - worry: Not on file  . Food insecurity - inability: Not on file  . Transportation needs - medical: Not on file  . Transportation needs - non-medical: Not on file  Occupational History  . Occupation: sales (HR outsourcing-)-recruiting    Employer: [employer]  Tobacco Use  . Smoking status: Never Smoker  . Smokeless tobacco: Never Used  Substance and Sexual Activity  . Alcohol use: Yes    Alcohol/week: 0.0 oz    Comment: She drinks 1 bottle of white wine during the week, and a bottle on the weekend (1-2 glasses a couple of days during the week, and a full bottle one day of the weekend; 2 bottles/week).  Marland Kitchen  Drug use: No  . Sexual activity: Yes    Partners: Male    Birth control/protection: None  Other Topics Concern  . Not on file  Social History Narrative   Divorced.  Shared custody of her 2 children. 1 dog.  Son Hart Carwin is on his 27th year at [employer].   Daughter Debe Coder has psych problems, drug use.  Currently living with her father (left [employer]).   Lives in townhome in [place] (downsized, no longer has yardwork; 2017)    Family History  Problem Relation Age of Onset  . Hypertension Father   . Asthma Son   . Cancer Maternal Aunt        melanoma  . Cancer Maternal  Aunt 60       colon cancer  . Colon cancer Maternal Aunt 36  . Drug abuse Daughter   . Diabetes Neg Hx   . Colon polyps Neg Hx   . Esophageal cancer Neg Hx   . Rectal cancer Neg Hx   . Stomach cancer Neg Hx     Outpatient Encounter Medications as of 03/07/2017  Medication Sig Note  . Calcium Carbonate-Vit D-Min (CALCIUM 1200 PO) Take 1 tablet by mouth daily.   . Multiple Vitamin (MULTIVITAMIN) tablet Take 1 tablet by mouth daily. 04/12/2015: daily  . Omega-3 1000 MG CAPS Take 1 capsule by mouth daily.   . Vitamin D, Cholecalciferol, 1000 units CAPS Take 1 capsule by mouth daily.   . valACYclovir (VALTREX) 1000 MG tablet TAKE 0.5 TABLETS (500 MG TOTAL) BY MOUTH 2 TIMES DAILY AS DIRECTED, AS NEEDED FOR RECURRENT INFECTION. (Patient not taking: Reported on 04/12/2015) 11/26/2013: Uses prn, not needing  . [DISCONTINUED] citalopram (CELEXA) 20 MG tablet TAKE 1 TABLET (20 MG TOTAL) BY MOUTH DAILY (Patient not taking: Reported on 06/11/2015)   . [DISCONTINUED] Vitamin D, Ergocalciferol, (DRISDOL) 50000 units CAPS capsule Take 1 capsule (50,000 Units total) by mouth every 7 (seven) days.    No facility-administered encounter medications on file as of 03/07/2017.     No Known Allergies  ROS: The patient denies anorexia, fever, headaches, vision changes, decreased hearing, ear pain, sore throat, breast concerns, chest pain, palpitations, dizziness, syncope, dyspnea on exertion, cough, swelling, nausea, vomiting, diarrhea, constipation, abdominal pain, melena, hematochezia, indigestion/heartburn, hematuria, incontinence, dysuria, vaginal bleeding (last period 07/2016), discharge, odor or itch, genital lesions, numbness, tingling, weakness, tremor, suspicious skin lesions, depression, anxiety, abnormal bleeding/bruising, or enlarged lymph nodes.  +hot flashes, night sweats, starting to improve.  Wakes up once a night, not sure why, and THEN she starts sweating. bodyaches for the last couple of days,  lowgrade fever, slept a lot and felt better. 10# weight loss since last visit.    PHYSICAL EXAM:  BP (!) 148/92   Pulse 84   Ht 5\' 2"  (1.575 m)   Wt 148 lb (67.1 kg)   LMP 07/23/2016   BMI 27.07 kg/m   Wt Readings from Last 3 Encounters:  03/07/17 148 lb (67.1 kg)  06/11/15 159 lb (72.1 kg)  05/28/15 152 lb (68.9 kg)   Repeat BP about the same (148/90)  General Appearance:  Alert, cooperative, no distress, appears stated age   Head:  Normocephalic, without obvious abnormality, atraumatic   Eyes:  PERRL, conjunctiva/corneas clear, EOM's intact, fundi benign   Ears:  Normal TM's and external ear canals   Nose:  Nares normal, mucosa normal, no drainage or sinus tenderness   Throat:  Lips, mucosa, and tongue normal; teeth and  gums normal   Neck:  Supple, no lymphadenopathy; thyroid: no enlargement/tenderness/ nodules; no carotid bruit or JVD   Back:  Spine nontender, no curvature, ROM normal, no CVA tenderness   Lungs:  Clear to auscultation bilaterally without wheezes, rales or ronchi; respirations unlabored   Chest Wall:  No tenderness or deformity   Heart:  Regular rate and rhythm, S1 and S2 normal, no murmur, rub or gallop   Breast Exam:  No masses, nipple discharge or inversion. No axillary lymphadenopathy. Fibroglandular changes noted, bilaterally, more prominent in LUOQ than on the right.  No dominant or discrete mass  Abdomen:  Soft, non-tender, nondistended, normoactive bowel sounds, no masses, no hepatosplenomegaly   Genitalia:  Normal external genitalia without lesions. BUS and vagina normal; no cervical lesions or cervical motion tenderness. No abnormal vaginal discharge. Uterus and adnexa not enlarged, nontender, no masses. Pap performed. (slightly friable, small amount of bleeding after pap)  Rectal:  Normal sphincter tone. No mass. Heme negative brown stool  Extremities:  No clubbing, cyanosis or edema    Pulses:  2+ and symmetric all extremities   Skin:  Skin color, texture, turgor normal, no rashes.   Lymph nodes:  Cervical, supraclavicular, and axillary nodes normal   Neurologic:  CNII-XII intact, normal strength, sensation and gait; reflexes 2+ and symmetric throughout    Psych:   Normal mood, affect, hygiene and grooming   ASSESSMENT/PLAN:  Annual physical exam - Plan: POCT Urinalysis DIP (Proadvantage Device), RPR, HIV antibody, Lipid panel, Comprehensive metabolic panel, CBC with Differential/Platelet, VITAMIN D 25 Hydroxy (Vit-D Deficiency, Fractures), TSH, Cytology - PAP([place])  Depression, major, in remission ([employer]) - doing well of medications, despite family stressors  Vitamin D deficiency - compliant with supplements. Due for recheck - Plan: VITAMIN D 25 Hydroxy (Vit-D Deficiency, Fractures)  Unprotected sex - screen for STD's. Encouraged safe sex (also because not entirely postmenopausal) - Plan: RPR, HIV antibody  Elevated blood pressure reading - counseled re: low sodium diet, increasing exercise, monitoring elsewhere. f/u 3 mos, sooner if persistently elevated    Pap smear with HPV and chlamydia shingrix recommended--to check insurance and schedule [place] (when available) Flu shot rec (declines) Past due for mammo--reminded to schedule  Counseled re alcohol use--encouraged to decrease intake, especially on weekends.    Discussed monthly self breast exams and yearly mammograms (past due), safe sex; at least 30 minutes of aerobic activity at least 5 days/week, weight bearing exercise at least 2x/week; proper sunscreen use reviewed; healthy diet, including goals of calcium and vitamin D intake and alcohol recommendations (less than or equal to 1 drink/day) reviewed; regular seatbelt use; changing batteries in smoke detectors. Immunization recommendations discussed--declines flu shot. Shingrix recommended--risks/side effects reviewed,  to check with insurance. Colonoscopy recommendations reviewed, UTD. 3 yr f/u recommended 05/2018.   F/u 3 mos with list of BP's. If all <130/80, can send [place] list and possibly cancel visit. Return sooner if consistently >140/90. Hoping regular cardio, low sodium diet, decreasing alcohol will result in lower BP's.

## 2017-03-07 ENCOUNTER — Other Ambulatory Visit (HOSPITAL_COMMUNITY)
Admission: RE | Admit: 2017-03-07 | Discharge: 2017-03-07 | Disposition: A | Discharge status: Home / Self Care | Payer: 59 | Source: Ambulatory Visit | Attending: Family Medicine | Admitting: Family Medicine

## 2017-03-07 ENCOUNTER — Ambulatory Visit (INDEPENDENT_AMBULATORY_CARE_PROVIDER_SITE_OTHER): Payer: 59 | Admitting: Family Medicine

## 2017-03-07 ENCOUNTER — Encounter: Payer: Self-pay | Admitting: Family Medicine

## 2017-03-07 VITALS — BP 148/92 | HR 84 | Ht 62.0 in | Wt 148.0 lb

## 2017-03-07 DIAGNOSIS — Z0000 Encounter for general adult medical examination without abnormal findings: Secondary | ICD-10-CM | POA: Diagnosis not present

## 2017-03-07 DIAGNOSIS — F325 Major depressive disorder, single episode, in full remission: Secondary | ICD-10-CM | POA: Diagnosis not present

## 2017-03-07 DIAGNOSIS — R030 Elevated blood-pressure reading, without diagnosis of hypertension: Secondary | ICD-10-CM

## 2017-03-07 DIAGNOSIS — E559 Vitamin D deficiency, unspecified: Secondary | ICD-10-CM

## 2017-03-07 DIAGNOSIS — Z7251 High risk heterosexual behavior: Secondary | ICD-10-CM | POA: Diagnosis not present

## 2017-03-07 LAB — POCT URINALYSIS DIP (PROADVANTAGE DEVICE)
Bilirubin, UA: NEGATIVE
GLUCOSE UA: NEGATIVE mg/dL
Ketones, POC UA: NEGATIVE mg/dL
Leukocytes, UA: NEGATIVE
NITRITE UA: NEGATIVE
PROTEIN UA: NEGATIVE mg/dL
RBC UA: NEGATIVE
Specific Gravity, Urine: 1.03
UUROB: NEGATIVE
pH, UA: 6 (ref 5.0–8.0)

## 2017-03-07 NOTE — Patient Instructions (Addendum)
HEALTH MAINTENANCE RECOMMENDATIONS:  It is recommended that you get at least 30 minutes of aerobic exercise at least 5 days/week (for weight loss, you may need as much as 60-90 minutes). This can be any activity that gets your heart rate up. This can be divided in 10-15 minute intervals if needed, but try and build up your endurance at least once a week.  Weight bearing exercise is also recommended twice weekly.  Eat a healthy diet with lots of vegetables, fruits and fiber.  "Colorful" foods have a lot of vitamins (ie green vegetables, tomatoes, red peppers, etc).  Limit sweet tea, regular sodas and alcoholic beverages, all of which has a lot of calories and sugar.  Up to 1 alcoholic drink daily may be beneficial for women (unless trying to lose weight, watch sugars).  Drink a lot of water.  Calcium recommendations are 1200-1500 mg daily (1500 mg for postmenopausal women or women without ovaries), and vitamin D 1000 IU daily.  This should be obtained from diet and/or supplements (vitamins), and calcium should not be taken all at once, but in divided doses.  Monthly self breast exams and yearly mammograms for women over the age of 59 is recommended.  Sunscreen of at least SPF 30 should be used on all sun-exposed parts of the skin when outside between the hours of 10 am and 4 pm (not just when at beach or pool, but even with exercise, golf, tennis, and yard work!)  Use a sunscreen that says "broad spectrum" so it covers both UVA and UVB rays, and make sure to reapply every 1-2 hours.  Remember to change the batteries in your smoke detectors when changing your clock times in the spring and fall.  Use your seat belt every time you are in a car, and please drive safely and not be distracted with cell phones and texting while driving.  I recommend getting the new shingles vaccine (Shingrix). You will need to check with your insurance to see if it is covered, and if covered by Medicare Part D, you need to  get from the pharmacy rather than our office.  It is a series of 2 injections, spaced 2 months apart.  Try cutting back on alcohol to 1/day during the week, only up to 2-3 max on the weekends.  Your next colonoscopy is due 05/2018.  Your blood pressure was elevated today.  Try and get at least 30 minutes of exercise daily, limit salt in the diet, and monitor blood pressure at least 2x/week. Record on the sheet given. Return in 3 months and bring that paper. If your blood pressure is consistently under 130/80, send me the paper and we can see if the appointment can be canceled.   Low-Sodium Eating Plan Sodium, which is an element that makes up salt, helps you maintain a healthy balance of fluids in your body. Too much sodium can increase your blood pressure and cause fluid and waste to be held in your body. Your health care provider or dietitian may recommend following this plan if you have high blood pressure (hypertension), kidney disease, liver disease, or heart failure. Eating less sodium can help lower your blood pressure, reduce swelling, and protect your heart, liver, and kidneys. What are tips for following this plan? General guidelines  Most people on this plan should limit their sodium intake to 1,500-2,000 mg (milligrams) of sodium each day. Reading food labels  The Nutrition Facts label lists the amount of sodium in one serving of the  food. If you eat more than one serving, you must multiply the listed amount of sodium by the number of servings.  Choose foods with less than 140 mg of sodium per serving.  Avoid foods with 300 mg of sodium or more per serving. Shopping  Look for lower-sodium products, often labeled as "low-sodium" or "no salt added."  Always check the sodium content even if foods are labeled as "unsalted" or "no salt added".  Buy fresh foods. ? Avoid canned foods and premade or frozen meals. ? Avoid canned, cured, or processed meats  Buy breads that have  less than 80 mg of sodium per slice. Cooking  Eat more home-cooked food and less restaurant, buffet, and fast food.  Avoid adding salt when cooking. Use salt-free seasonings or herbs instead of table salt or sea salt. Check with your health care provider or pharmacist before using salt substitutes.  Cook with plant-based oils, such as canola, sunflower, or olive oil. Meal planning  When eating at a restaurant, ask that your food be prepared with less salt or no salt, if possible.  Avoid foods that contain MSG (monosodium glutamate). MSG is sometimes added to [place] food, bouillon, and some canned foods. What foods are recommended? The items listed may not be a complete list. Talk with your dietitian about what dietary choices are best for you. Grains Low-sodium cereals, including oats, puffed wheat and rice, and shredded wheat. Low-sodium crackers. Unsalted rice. Unsalted pasta. Low-sodium bread. Whole-grain breads and whole-grain pasta. Vegetables Fresh or frozen vegetables. "No salt added" canned vegetables. "No salt added" tomato sauce and paste. Low-sodium or reduced-sodium tomato and vegetable juice. Fruits Fresh, frozen, or canned fruit. Fruit juice. Meats and other protein foods Fresh or frozen (no salt added) meat, poultry, seafood, and fish. Low-sodium canned tuna and salmon. Unsalted nuts. Dried peas, beans, and lentils without added salt. Unsalted canned beans. Eggs. Unsalted nut butters. Dairy Milk. Soy milk. Cheese that is naturally low in sodium, such as ricotta cheese, fresh mozzarella, or Swiss cheese Low-sodium or reduced-sodium cheese. Cream cheese. Yogurt. Fats and oils Unsalted butter. Unsalted margarine with no trans fat. Vegetable oils such as canola or olive oils. Seasonings and other foods Fresh and dried herbs and spices. Salt-free seasonings. Low-sodium mustard and ketchup. Sodium-free salad dressing. Sodium-free light mayonnaise. Fresh or refrigerated  horseradish. Lemon juice. Vinegar. Homemade, reduced-sodium, or low-sodium soups. Unsalted popcorn and pretzels. Low-salt or salt-free chips. What foods are not recommended? The items listed may not be a complete list. Talk with your dietitian about what dietary choices are best for you. Grains Instant hot cereals. Bread stuffing, pancake, and biscuit mixes. Croutons. Seasoned rice or pasta mixes. Noodle soup cups. Boxed or frozen macaroni and cheese. Regular salted crackers. Self-rising flour. Vegetables Sauerkraut, pickled vegetables, and relishes. Olives. [place] fries. Onion rings. Regular canned vegetables (not low-sodium or reduced-sodium). Regular canned tomato sauce and paste (not low-sodium or reduced-sodium). Regular tomato and vegetable juice (not low-sodium or reduced-sodium). Frozen vegetables in sauces. Meats and other protein foods Meat or fish that is salted, canned, smoked, spiced, or pickled. Bacon, ham, sausage, hotdogs, corned beef, chipped beef, packaged lunch meats, salt pork, jerky, pickled herring, anchovies, regular canned tuna, sardines, salted nuts. Dairy Processed cheese and cheese spreads. Cheese curds. Blue cheese. Feta cheese. String cheese. Regular cottage cheese. Buttermilk. Canned milk. Fats and oils Salted butter. Regular margarine. Ghee. Bacon fat. Seasonings and other foods Onion salt, garlic salt, seasoned salt, table salt, and sea salt. Canned and packaged  gravies. Worcestershire sauce. Tartar sauce. Barbecue sauce. Teriyaki sauce. Soy sauce, including reduced-sodium. Steak sauce. Fish sauce. Oyster sauce. Cocktail sauce. Horseradish that you find on the shelf. Regular ketchup and mustard. Meat flavorings and tenderizers. Bouillon cubes. Hot sauce and Tabasco sauce. Premade or packaged marinades. Premade or packaged taco seasonings. Relishes. Regular salad dressings. Salsa. Potato and tortilla chips. Corn chips and puffs. Salted popcorn and pretzels. Canned or  dried soups. Pizza. Frozen entrees and pot pies. Summary  Eating less sodium can help lower your blood pressure, reduce swelling, and protect your heart, liver, and kidneys.  Most people on this plan should limit their sodium intake to 1,500-2,000 mg (milligrams) of sodium each day.  Canned, boxed, and frozen foods are high in sodium. Restaurant foods, fast foods, and pizza are also very high in sodium. You also get sodium by adding salt to food.  Try to cook at home, eat more fresh fruits and vegetables, and eat less fast food, canned, processed, or prepared foods. This information is not intended to replace advice given to you by your health care provider. Make sure you discuss any questions you have with your health care provider. Document Released: 07/01/2001 Document Revised: 01/03/2016 Document Reviewed: 01/03/2016 Elsevier Interactive Patient Education  [employer].

## 2017-03-08 LAB — COMPREHENSIVE METABOLIC PANEL
ALK PHOS: 81 IU/L (ref 39–117)
ALT: 33 IU/L — AB (ref 0–32)
AST: 24 IU/L (ref 0–40)
Albumin/Globulin Ratio: 1.7 (ref 1.2–2.2)
Albumin: 4.7 g/dL (ref 3.5–5.5)
BUN/Creatinine Ratio: 15 (ref 9–23)
BUN: 11 mg/dL (ref 6–24)
Bilirubin Total: 0.3 mg/dL (ref 0.0–1.2)
CO2: 24 mmol/L (ref 20–29)
CREATININE: 0.75 mg/dL (ref 0.57–1.00)
Calcium: 10.2 mg/dL (ref 8.7–10.2)
Chloride: 101 mmol/L (ref 96–106)
GFR calc Af Amer: 106 mL/min/{1.73_m2} (ref >59)
GFR calc non Af Amer: 92 mL/min/{1.73_m2} (ref >59)
GLOBULIN, TOTAL: 2.8 g/dL (ref 1.5–4.5)
GLUCOSE: 94 mg/dL (ref 65–99)
POTASSIUM: 5.1 mmol/L (ref 3.5–5.2)
SODIUM: 141 mmol/L (ref 134–144)
Total Protein: 7.5 g/dL (ref 6.0–8.5)

## 2017-03-08 LAB — CBC WITH DIFFERENTIAL/PLATELET
Basophils Absolute: 0 10*3/uL (ref 0.0–0.2)
Basos: 1 %
EOS (ABSOLUTE): 0.1 10*3/uL (ref 0.0–0.4)
EOS: 2 %
Hematocrit: 42.5 % (ref 34.0–46.6)
Hemoglobin: 14.3 g/dL (ref 11.1–15.9)
IMMATURE GRANULOCYTES: 0 %
Immature Grans (Abs): 0 10*3/uL (ref 0.0–0.1)
Lymphocytes Absolute: 2.2 10*3/uL (ref 0.7–3.1)
Lymphs: 38 %
MCH: 30.8 pg (ref 26.6–33.0)
MCHC: 33.6 g/dL (ref 31.5–35.7)
MCV: 91 fL (ref 79–97)
MONOS ABS: 0.3 10*3/uL (ref 0.1–0.9)
Monocytes: 5 %
NEUTROS PCT: 54 %
Neutrophils Absolute: 3.3 10*3/uL (ref 1.4–7.0)
PLATELETS: 315 10*3/uL (ref 150–379)
RBC: 4.65 x10E6/uL (ref 3.77–5.28)
RDW: 14.2 % (ref 12.3–15.4)
WBC: 6 10*3/uL (ref 3.4–10.8)

## 2017-03-08 LAB — LIPID PANEL
CHOL/HDL RATIO: 3.5 ratio (ref 0.0–4.4)
CHOLESTEROL TOTAL: 269 mg/dL — AB (ref 100–199)
HDL: 76 mg/dL (ref >39)
LDL CALC: 122 mg/dL — AB (ref 0–99)
Triglycerides: 355 mg/dL — ABNORMAL HIGH (ref 0–149)
VLDL CHOLESTEROL CAL: 71 mg/dL — AB (ref 5–40)

## 2017-03-08 LAB — HIV ANTIBODY (ROUTINE TESTING W REFLEX): HIV Screen 4th Generation wRfx: NONREACTIVE

## 2017-03-08 LAB — VITAMIN D 25 HYDROXY (VIT D DEFICIENCY, FRACTURES): VIT D 25 HYDROXY: 17.3 ng/mL — AB (ref 30.0–100.0)

## 2017-03-08 LAB — TSH: TSH: 2.02 u[IU]/mL (ref 0.450–4.500)

## 2017-03-08 LAB — RPR: RPR Ser Ql: NONREACTIVE

## 2017-03-08 MED ORDER — VITAMIN D (ERGOCALCIFEROL) 1.25 MG (50000 UNIT) PO CAPS: 50000.0000 [IU] | ORAL_CAPSULE | ORAL | 0 refills | Status: DC

## 2017-03-08 NOTE — Addendum Note (Signed)
Addended by: Rita Ohara on: 03/08/2017 07:44 AM   Modules accepted: Orders

## 2017-03-09 ENCOUNTER — Other Ambulatory Visit: Payer: Self-pay | Admitting: Family Medicine

## 2017-03-09 DIAGNOSIS — Z1231 Encounter for screening mammogram for malignant neoplasm of breast: Secondary | ICD-10-CM

## 2017-03-09 LAB — CYTOLOGY - PAP
CHLAMYDIA, DNA PROBE: NEGATIVE
DIAGNOSIS: NEGATIVE
HPV (WINDOPATH): NOT DETECTED
Neisseria Gonorrhea: NEGATIVE

## 2017-04-10 ENCOUNTER — Ambulatory Visit
Admission: RE | Admit: 2017-04-10 | Discharge: 2017-04-10 | Disposition: A | Discharge status: Home / Self Care | Payer: BLUE CROSS/BLUE SHIELD | Source: Ambulatory Visit | Attending: Family Medicine | Admitting: Family Medicine

## 2017-04-10 DIAGNOSIS — Z1231 Encounter for screening mammogram for malignant neoplasm of breast: Secondary | ICD-10-CM

## 2017-06-05 NOTE — Progress Notes (Signed)
Chief Complaint  Patient presents with  . Hypertension    patient is non-fasting (front told her she didn't need to be) today. Had coffee with unsweetened almond milk. 3 month bp follow up.    Patient presents for 3 month follow-up.  Elevated blood pressures: She cut back on salt, crackers. BP's elsewhere have been running 140's/90's, maybe higher. She checked at [facility] twice, were "about the same as here".  Went on vacations x 2 Has a blood pressure monitor but read VERY high, didn't believe it. Didn't bring it today. Yoga 3x/week, fast walks with dog 3x/week.  Elevated Triglycerides:  TG noted to be elevated in February. She was asked to follow lowfat diet, cut back significantly on wine intake, and take omega-3 fish oil 3000mg  daily. She reports she didn't check her MyChart. Is taking 1 large fish oil daily. She didn't cut back on wine (and reports she won't).  She is not fasting for labs today.  Lab Results  Component Value Date   CHOL 269 (H) 03/07/2017   HDL 76 03/07/2017   LDLCALC 122 (H) 03/07/2017   TRIG 355 (H) 03/07/2017   CHOLHDL 3.5 03/07/2017   Lab Results  Component Value Date   ALT 33 (H) 03/07/2017   AST 24 03/07/2017   ALKPHOS 81 03/07/2017   BILITOT 0.3 03/07/2017   Vitamin D deficiency:  Last check in February 2019 was 17.3, despite taking MVI, separate D3 and Ca with D.  She was treated with another 12 weeks of rx. Unfortunately, she reports she only took rx x 4 weeks (didn't give her all 12 at once, didn't get refill from pharmacy).  Currently taking 4000 IU daily.  PMH, PSH, SH reviewed  Outpatient Encounter Medications as of 06/06/2017  Medication Sig Note  . Calcium Carbonate-Vit D-Min (CALCIUM 1200 PO) Take 1 tablet by mouth daily.   . Multiple Vitamin (MULTIVITAMIN) tablet Take 1 tablet by mouth daily. 04/12/2015: daily  . Omega-3 1000 MG CAPS Take 1 capsule by mouth daily.   . Vitamin D, Cholecalciferol, 1000 units CAPS Take 1 capsule by mouth daily.  06/07/2017: Reports she is taking 4000 IU daily  . hydrochlorothiazide (MICROZIDE) 12.5 MG capsule Take 1 capsule by mouth once daily; increase to 2 capsules if BP remains >135/85 after 1 week.   . valACYclovir (VALTREX) 1000 MG tablet TAKE 0.5 TABLETS (500 MG TOTAL) BY MOUTH 2 TIMES DAILY AS DIRECTED, AS NEEDED FOR RECURRENT INFECTION. (Patient not taking: Reported on 04/12/2015) 11/26/2013: Uses prn, not needing  . [DISCONTINUED] Vitamin D, Ergocalciferol, (DRISDOL) 50000 units CAPS capsule Take 1 capsule (50,000 Units total) by mouth every 7 (seven) days.    No facility-administered encounter medications on file as of 06/06/2017.    (HCTZ started AFTER visit, not taking prior)  No Known Allergies  ROS:  No fever, chills, URI symptoms, headaches, dizziness, shortness of breath, chest pain, GI complaints. Moods are good, despite same stressors (children).   PHYSICAL EXAM:  BP 136/90   Pulse 72   Ht 5\' 2"  (1.575 m)   Wt 151 lb (68.5 kg)   LMP 07/23/2016   BMI 27.62 kg/m   Wt Readings from Last 3 Encounters:  06/06/17 151 lb (68.5 kg)  03/07/17 148 lb (67.1 kg)  06/11/15 159 lb (72.1 kg)   BP Readings from Last 3 Encounters:  06/06/17 136/90  03/07/17 (!) 148/92  06/11/15 102/81   Well appearing, pleasant female in no distress HEENT: EOMI, conjunctiva and sclera are clear, OP  clear Neck: no lymphadenopathy, thyromegaly or mass, no carotid bruit. Heart: regular rate and rhythm, no murmur Lungs: clear bilaterally Extremities: no edema Psych: normal mood, affect, hygiene and grooming Neuro: alert and oriented, cranial nerves intact, normal gait  EKG: NSR, normal   ASSESSMENT/PLAN:  Essential hypertension - persistently elevated BP's. start low dose HCTZ (risks/SE reviewed, dietary K); low Na diet, exercise. Bring monitor to verify accuracy - Plan: hydrochlorothiazide (MICROZIDE) 12.5 MG capsule, Comprehensive metabolic panel  Vitamin D deficiency - only took 4 weeks of Rx,  taking 4000 IU since. Can pick up RF. Recheck when returns for fasting labs - Plan: VITAMIN D 25 Hydroxy (Vit-D Deficiency, Fractures)  Elevated blood pressure reading - Plan: EKG 12-Lead  Hypertriglyceridemia - encouraged to increase fish oil, cut back on alcohol. To return fasting for next visit for recheck - Plan: Lipid panel  Medication monitoring encounter - Plan: Comprehensive metabolic panel   Return in 4 weeks ,fasting labs prior.  Continue to limit salt in your diet.   Return for fasting labs and bring in your blood pressure monitor with you. Pick up your refill of Vitamin D prescription. We will let you know the results 1-2 days after you come for labs.  Limiting alcohol, sweets and fried foods should help with triglycerides. It is important to limit alcohol to 5-6 ounces/day, and preferably not drink every day.  We will be rechecking liver tests with your labs.  Start the diuretic once daily in the morning. Continue to get bananas in your diet.  Increase to 2 capsules after a week if BP remains >135/85. Bring your list of BP's to your follow up.

## 2017-06-06 ENCOUNTER — Encounter: Payer: Self-pay | Admitting: Family Medicine

## 2017-06-06 ENCOUNTER — Ambulatory Visit: Payer: 59 | Admitting: Family Medicine

## 2017-06-06 VITALS — BP 136/90 | HR 72 | Ht 62.0 in | Wt 151.0 lb

## 2017-06-06 DIAGNOSIS — E781 Pure hyperglyceridemia: Secondary | ICD-10-CM | POA: Diagnosis not present

## 2017-06-06 DIAGNOSIS — R030 Elevated blood-pressure reading, without diagnosis of hypertension: Secondary | ICD-10-CM

## 2017-06-06 DIAGNOSIS — Z5181 Encounter for therapeutic drug level monitoring: Secondary | ICD-10-CM

## 2017-06-06 DIAGNOSIS — E559 Vitamin D deficiency, unspecified: Secondary | ICD-10-CM | POA: Diagnosis not present

## 2017-06-06 DIAGNOSIS — I10 Essential (primary) hypertension: Secondary | ICD-10-CM

## 2017-06-06 MED ORDER — HYDROCHLOROTHIAZIDE 12.5 MG PO CAPS
ORAL_CAPSULE | ORAL | 0 refills | Status: DC
Start: 2017-06-06 — End: 2017-07-12

## 2017-06-06 NOTE — Patient Instructions (Signed)
Continue to limit salt in your diet.   Return for fasting labs and bring in your blood pressure monitor with you. Pick up your refill of Vitamin D prescription. We will let you know the results 1-2 days after you come for labs.  Limiting alcohol, sweets and fried foods should help with triglycerides. It is important to limit alcohol to 5-6 ounces/day, and preferably not drink every day.  We will be rechecking liver tests with your labs.  Start the diuretic once daily in the morning. Continue to get bananas in your diet.  Increase to 2 capsules after a week if BP remains >135/85. Bring your list of BP's to your follow up.   Food Choices to Lower Your Triglycerides Triglycerides are a type of fat in your blood. High levels of triglycerides can increase the risk of heart disease and stroke. If your triglyceride levels are high, the foods you eat and your eating habits are very important. Choosing the right foods can help lower your triglycerides. What general guidelines do I need to follow?  Lose weight if you are overweight.  Limit or avoid alcohol.  Fill one half of your plate with vegetables and green salads.  Limit fruit to two servings a day. Choose fruit instead of juice.  Make one fourth of your plate whole grains. Look for the word "whole" as the first word in the ingredient list.  Fill one fourth of your plate with lean protein foods.  Enjoy fatty fish (such as salmon, mackerel, sardines, and tuna) three times a week.  Choose healthy fats.  Limit foods high in starch and sugar.  Eat more home-cooked food and less restaurant, buffet, and fast food.  Limit fried foods.  Cook foods using methods other than frying.  Limit saturated fats.  Check ingredient lists to avoid foods with partially hydrogenated oils (trans fats) in them. What foods can I eat? Grains Whole grains, such as whole wheat or whole grain breads, crackers, cereals, and pasta. Unsweetened oatmeal,  bulgur, barley, quinoa, or brown rice. Corn or whole wheat flour tortillas. Vegetables Fresh or frozen vegetables (raw, steamed, roasted, or grilled). Green salads. Fruits All fresh, canned (in natural juice), or frozen fruits. Meat and Other Protein Products Ground beef (85% or leaner), grass-fed beef, or beef trimmed of fat. Skinless chicken or [place]. Ground chicken or [place]. Pork trimmed of fat. All fish and seafood. Eggs. Dried beans, peas, or lentils. Unsalted nuts or seeds. Unsalted canned or dry beans. Dairy Low-fat dairy products, such as skim or 1% milk, 2% or reduced-fat cheeses, low-fat ricotta or cottage cheese, or plain low-fat yogurt. Fats and Oils Tub margarines without trans fats. Light or reduced-fat mayonnaise and salad dressings. Avocado. Safflower, olive, or canola oils. Natural peanut or almond butter. The items listed above may not be a complete list of recommended foods or beverages. Contact your dietitian for more options. What foods are not recommended? Grains White bread. White pasta. White rice. Cornbread. Bagels, pastries, and croissants. Crackers that contain trans fat. Vegetables White potatoes. Corn. Creamed or fried vegetables. Vegetables in a cheese sauce. Fruits Dried fruits. Canned fruit in light or heavy syrup. Fruit juice. Meat and Other Protein Products Fatty cuts of meat. Ribs, chicken wings, bacon, sausage, bologna, salami, chitterlings, fatback, hot dogs, bratwurst, and packaged luncheon meats. Dairy Whole or 2% milk, cream, half-and-half, and cream cheese. Whole-fat or sweetened yogurt. Full-fat cheeses. Nondairy creamers and whipped toppings. Processed cheese, cheese spreads, or cheese curds. Sweets and Desserts Corn  syrup, sugars, honey, and molasses. Candy. Jam and jelly. Syrup. Sweetened cereals. Cookies, pies, cakes, donuts, muffins, and ice cream. Fats and Oils Butter, stick margarine, lard, shortening, ghee, or bacon fat. Coconut, palm  kernel, or palm oils. Beverages Alcohol. Sweetened drinks (such as sodas, lemonade, and fruit drinks or punches). The items listed above may not be a complete list of foods and beverages to avoid. Contact your dietitian for more information. This information is not intended to replace advice given to you by your health care provider. Make sure you discuss any questions you have with your health care provider. Document Released: 10/28/2003 Document Revised: 06/17/2015 Document Reviewed: 11/13/2012 Elsevier Interactive Patient Education  2017 [employer].

## 2017-06-07 ENCOUNTER — Encounter: Payer: Self-pay | Admitting: Family Medicine

## 2017-06-07 DIAGNOSIS — I10 Essential (primary) hypertension: Secondary | ICD-10-CM | POA: Insufficient documentation

## 2017-07-03 ENCOUNTER — Other Ambulatory Visit: Payer: Self-pay

## 2017-07-11 ENCOUNTER — Other Ambulatory Visit: Payer: 59

## 2017-07-11 DIAGNOSIS — I10 Essential (primary) hypertension: Secondary | ICD-10-CM | POA: Diagnosis not present

## 2017-07-11 DIAGNOSIS — Z5181 Encounter for therapeutic drug level monitoring: Secondary | ICD-10-CM

## 2017-07-11 DIAGNOSIS — E781 Pure hyperglyceridemia: Secondary | ICD-10-CM

## 2017-07-11 DIAGNOSIS — E559 Vitamin D deficiency, unspecified: Secondary | ICD-10-CM | POA: Diagnosis not present

## 2017-07-11 NOTE — Progress Notes (Signed)
Chief Complaint  Patient presents with  . Hypertension    nonfasting med check, labs done yesterday. No concerns today.    Patient presents for follow-up on hypertension, elevated TG and Vitamin D deficiency. She had labs done prior to her visit, see below.  Hypertension: She was started on HCTZ 12.5 mg last month.  She started this on 6/3, and increased to 76m (2 pills) on 6/10 due to BP's remaining elevated.  She is tolerating this without side effects, trying to get dietary K+. Denies muscle cramps.   BP's have been running as low as 134/70 (but sneezed while it was being taken) up to 167/96 on 6/10 (the day she doubled up). Checked it only once since doubling the dose, and it was 146/92 yesterday at 5pm. She brings in her monitor to check accuracy.  Cut back on crackers. Still salting food some.  Doesn't eat much salty foods in general.  Eats a healthy diet.  Elevated Triglycerides:  TG noted to be elevated in February. This was after some vacations. She was asked to follow lowfat diet, cut back significantly on wine intake, and take omega-3 fish oil 30021mdaily.  She never saw those MyChart recommendations and was taking fish oil daily (110070m She didn't cut back on wine (and reports she won't, though is now considering to help lose weight).  She had labs repeated yesterday (see below).  Prior labs: Lab Results  Component Value Date   CHOL 269 (H) 03/07/2017   HDL 76 03/07/2017   LDLCALC 122 (H) 03/07/2017   TRIG 355 (H) 03/07/2017   CHOLHDL 3.5 03/07/2017    Vitamin D deficiency:  Last check in February 2019 was 17.3, despite taking MVI, separate D3 and Ca with D.  She was prescribed 12 weeks of rx. Unfortunately, at her visit last month she reports she realized she only took x 4 weeks (pharmacy didn't give her all 12 at once, didn't get refill from pharmacy). She had been taking 4000 IU daily. Since last visit in May, she resumed taking weekly rx Vit D. She finished the weekly rx,  and is currently taking 5000 IU daily.  PMH, PSH, SH reviewed  Outpatient Encounter Medications as of 07/12/2017  Medication Sig Note  . Calcium Carbonate-Vit D-Min (CALCIUM 1200 PO) Take 1 tablet by mouth daily.   . Cholecalciferol (VITAMIN D3) 5000 units CAPS Take 1 capsule by mouth daily.   . Multiple Vitamin (MULTIVITAMIN) tablet Take 1 tablet by mouth daily. 04/12/2015: daily  . Omega-3 1000 MG CAPS Take 1 capsule by mouth daily. 07/12/2017: 1100m4mily  . [DISCONTINUED] hydrochlorothiazide (MICROZIDE) 12.5 MG capsule Take 1 capsule by mouth once daily; increase to 2 capsules if BP remains >135/85 after 1 week. 07/12/2017: Taking 25mg40mce 07/02/17  . hydrochlorothiazide (HYDRODIURIL) 25 MG tablet Take 1 tablet (25 mg total) by mouth daily.   . valACYclovir (VALTREX) 1000 MG tablet TAKE 0.5 TABLETS (500 MG TOTAL) BY MOUTH 2 TIMES DAILY AS DIRECTED, AS NEEDED FOR RECURRENT INFECTION. (Patient not taking: Reported on 04/12/2015) 11/26/2013: Uses prn, not needing  . [DISCONTINUED] Vitamin D, Cholecalciferol, 1000 units CAPS Take 1 capsule by mouth daily. 06/07/2017: Reports she is taking 4000 IU daily  . [DISCONTINUED] Vitamin D, Ergocalciferol, (DRISDOL) 50000 units CAPS capsule     No facility-administered encounter medications on file as of 07/12/2017.    (taking two HCT 12.5mg d67my prior to visit).  No Known Allergies   ROS: no headaches, dizziness, chest pain, palpitations, shortness  of breath, fever, chills, URI or allergy symptoms, GI or GU complaints, muscle cramps, edema, depression, anxiety, pain or other complaints.    PHYSICAL EXAM:  BP 122/90   Pulse 92   Ht '5\' 2"'  (1.575 m)   Wt 147 lb 12.8 oz (67 kg)   LMP 07/23/2016   BMI 27.03 kg/m    Wt Readings from Last 3 Encounters:  07/12/17 147 lb 12.8 oz (67 kg)  06/06/17 151 lb (68.5 kg)  03/07/17 148 lb (67.1 kg)   Check of monitor by nurse: 128/94 Nurse BP: 122/90  Repeat by MD 130/74 RA Repeat with pt's monitor  (LA)  127/86 124/80 on LA by MD  Well appearing, pleasant female, who is in very good spirits, having come to office after yoga class. HEENT conjunctiva and sclera are clear, EOMI Neck: no lymphadenopathy or mass Heart: regular rate and rhythm, no murmur Lungs: clear bilaterally Extremities: no edema Psych: normal mood, affect, hygiene and grooming Skin: normal turgor, no rash Neuro: alert and oriented, cranial nerves intact, normal gait   Vit D-OH 40.6    Chemistry      Component Value Date/Time   NA 139 07/11/2017 0854   K 5.0 07/11/2017 0854   CL 99 07/11/2017 0854   CO2 27 07/11/2017 0854   BUN 12 07/11/2017 0854   CREATININE 0.92 07/11/2017 0854   CREATININE 0.70 11/26/2013 1514      Component Value Date/Time   CALCIUM 10.3 (H) 07/11/2017 0854   ALKPHOS 74 07/11/2017 0854   AST 28 07/11/2017 0854   ALT 30 07/11/2017 0854   BILITOT 0.4 07/11/2017 0854     Lab Results  Component Value Date   CHOL 248 (H) 07/11/2017   HDL 79 07/11/2017   LDLCALC 132 (H) 07/11/2017   TRIG 185 (H) 07/11/2017   CHOLHDL 3.1 07/11/2017    ASSESSMENT/PLAN:  Essential hypertension - BP good in office today; continue 71m HCTZ, dietary potassium, and monitoring. Cont exercise, low Na diet, weight loss - Plan: hydrochlorothiazide (HYDRODIURIL) 25 MG tablet  Vitamin D deficiency - adequately replaced. Continue 5000 IU daily  Hypertriglyceridemia - TG improved, still slightly high. LDL went up slightly. Increase fish oil to 2/d; cont lowfat diet. Reviewed low cholesterol diet   Double up on fish oil Discussed low cholesterol diet. Cont 5000 IU of vitamin D3 daily   F/u 4-5 months Labs prior D, lipids, b-met

## 2017-07-12 ENCOUNTER — Ambulatory Visit: Payer: 59 | Admitting: Family Medicine

## 2017-07-12 ENCOUNTER — Encounter: Payer: Self-pay | Admitting: Family Medicine

## 2017-07-12 VITALS — BP 122/90 | HR 92 | Ht 62.0 in | Wt 147.8 lb

## 2017-07-12 DIAGNOSIS — I10 Essential (primary) hypertension: Secondary | ICD-10-CM | POA: Diagnosis not present

## 2017-07-12 DIAGNOSIS — E781 Pure hyperglyceridemia: Secondary | ICD-10-CM

## 2017-07-12 DIAGNOSIS — E559 Vitamin D deficiency, unspecified: Secondary | ICD-10-CM

## 2017-07-12 DIAGNOSIS — Z5181 Encounter for therapeutic drug level monitoring: Secondary | ICD-10-CM

## 2017-07-12 LAB — LIPID PANEL
CHOLESTEROL TOTAL: 248 mg/dL — AB (ref 100–199)
Chol/HDL Ratio: 3.1 ratio (ref 0.0–4.4)
HDL: 79 mg/dL (ref >39)
LDL Calculated: 132 mg/dL — ABNORMAL HIGH (ref 0–99)
TRIGLYCERIDES: 185 mg/dL — AB (ref 0–149)
VLDL CHOLESTEROL CAL: 37 mg/dL (ref 5–40)

## 2017-07-12 LAB — COMPREHENSIVE METABOLIC PANEL
A/G RATIO: 1.8 (ref 1.2–2.2)
ALK PHOS: 74 IU/L (ref 39–117)
ALT: 30 IU/L (ref 0–32)
AST: 28 IU/L (ref 0–40)
Albumin: 4.8 g/dL (ref 3.5–5.5)
BILIRUBIN TOTAL: 0.4 mg/dL (ref 0.0–1.2)
BUN/Creatinine Ratio: 13 (ref 9–23)
BUN: 12 mg/dL (ref 6–24)
CHLORIDE: 99 mmol/L (ref 96–106)
CO2: 27 mmol/L (ref 20–29)
Calcium: 10.3 mg/dL — ABNORMAL HIGH (ref 8.7–10.2)
Creatinine, Ser: 0.92 mg/dL (ref 0.57–1.00)
GFR calc non Af Amer: 72 mL/min/{1.73_m2} (ref >59)
GFR, EST AFRICAN AMERICAN: 83 mL/min/{1.73_m2} (ref >59)
GLUCOSE: 95 mg/dL (ref 65–99)
Globulin, Total: 2.6 g/dL (ref 1.5–4.5)
Potassium: 5 mmol/L (ref 3.5–5.2)
Sodium: 139 mmol/L (ref 134–144)
Total Protein: 7.4 g/dL (ref 6.0–8.5)

## 2017-07-12 LAB — VITAMIN D 25 HYDROXY (VIT D DEFICIENCY, FRACTURES): VIT D 25 HYDROXY: 40.6 ng/mL (ref 30.0–100.0)

## 2017-07-12 MED ORDER — HYDROCHLOROTHIAZIDE 25 MG PO TABS: 25.0000 mg | ORAL_TABLET | Freq: Every day | ORAL | 1 refills | Status: DC

## 2017-07-12 NOTE — Patient Instructions (Signed)
Continue low fat, low cholesterol diet. Continue to monitor and limit sodium in your diet. Continue the HCTZ--we are changing to a 25mg  tablet so you only have to take one. Continue dietary potassium and staying well hydrated.  Continue your 5000 IU of Vitamin D3 daily.  Continue to monitor blood pressure--recheck after a few minutes of relaxing if it is high. Contact us if it is persistently >140/90 rather than waiting for your next visit. Goal is <130/80 up to 135/85 (under 140/90 most of the time, occasionally higher is okay if there is a reason).  Double up on the fish oil to further decrease your triglycerides. Cutting back on the wine will also help lower triglycerides further. The LDL was a little high--goal is under 130. Here is some info on low cholesterol diet (last time was mostly just for triglycerides).   Fat and Cholesterol Restricted Diet Getting too much fat and cholesterol in your diet may cause health problems. Following this diet helps keep your fat and cholesterol at normal levels. This can keep you from getting sick. What types of fat should I choose?  Choose monosaturated and polyunsaturated fats. These are found in foods such as olive oil, canola oil, flaxseeds, walnuts, almonds, and seeds.  Eat more omega-3 fats. Good choices include salmon, mackerel, sardines, tuna, flaxseed oil, and ground flaxseeds.  Limit saturated fats. These are in animal products such as meats, butter, and cream. They can also be in plant products such as palm oil, palm kernel oil, and coconut oil.  Avoid foods with partially hydrogenated oils in them. These contain trans fats. Examples of foods that have trans fats are stick margarine, some tub margarines, cookies, crackers, and other baked goods. What general guidelines do I need to follow?  Check food labels. Look for the words "trans fat" and "saturated fat."  When preparing a meal: ? Fill half of your plate with vegetables and green  salads. ? Fill one fourth of your plate with whole grains. Look for the word "whole" as the first word in the ingredient list. ? Fill one fourth of your plate with lean protein foods.  Eat more foods that have fiber, like apples, carrots, beans, peas, and barley.  Eat more home-cooked foods. Eat less at restaurants and buffets.  Limit or avoid alcohol.  Limit foods high in starch and sugar.  Limit fried foods.  Cook foods without frying them. Baking, boiling, grilling, and broiling are all great options.  Lose weight if you are overweight. Losing even a small amount of weight can help your overall health. It can also help prevent diseases such as diabetes and heart disease. What foods can I eat? Grains Whole grains, such as whole wheat or whole grain breads, crackers, cereals, and pasta. Unsweetened oatmeal, bulgur, barley, quinoa, or brown rice. Corn or whole wheat flour tortillas. Vegetables Fresh or frozen vegetables (raw, steamed, roasted, or grilled). Green salads. Fruits All fresh, canned (in natural juice), or frozen fruits. Meat and Other Protein Products Ground beef (85% or leaner), grass-fed beef, or beef trimmed of fat. Skinless chicken or [place]. Ground chicken or [place]. Pork trimmed of fat. All fish and seafood. Eggs. Dried beans, peas, or lentils. Unsalted nuts or seeds. Unsalted canned or dry beans. Dairy Low-fat dairy products, such as skim or 1% milk, 2% or reduced-fat cheeses, low-fat ricotta or cottage cheese, or plain low-fat yogurt. Fats and Oils Tub margarines without trans fats. Light or reduced-fat mayonnaise and salad dressings. Avocado. Olive, canola, sesame,  or safflower oils. Natural peanut or almond butter (choose ones without added sugar and oil). The items listed above may not be a complete list of recommended foods or beverages. Contact your dietitian for more options. What foods are not recommended? Grains White bread. White pasta. White rice.  Cornbread. Bagels, pastries, and croissants. Crackers that contain trans fat. Vegetables White potatoes. Corn. Creamed or fried vegetables. Vegetables in a cheese sauce. Fruits Dried fruits. Canned fruit in light or heavy syrup. Fruit juice. Meat and Other Protein Products Fatty cuts of meat. Ribs, chicken wings, bacon, sausage, bologna, salami, chitterlings, fatback, hot dogs, bratwurst, and packaged luncheon meats. Liver and organ meats. Dairy Whole or 2% milk, cream, half-and-half, and cream cheese. Whole milk cheeses. Whole-fat or sweetened yogurt. Full-fat cheeses. Nondairy creamers and whipped toppings. Processed cheese, cheese spreads, or cheese curds. Sweets and Desserts Corn syrup, sugars, honey, and molasses. Candy. Jam and jelly. Syrup. Sweetened cereals. Cookies, pies, cakes, donuts, muffins, and ice cream. Fats and Oils Butter, stick margarine, lard, shortening, ghee, or bacon fat. Coconut, palm kernel, or palm oils. Beverages Alcohol. Sweetened drinks (such as sodas, lemonade, and fruit drinks or punches). The items listed above may not be a complete list of foods and beverages to avoid. Contact your dietitian for more information. This information is not intended to replace advice given to you by your health care provider. Make sure you discuss any questions you have with your health care provider. Document Released: 07/11/2011 Document Revised: 09/16/2015 Document Reviewed: 04/10/2013 Elsevier Interactive Patient Education  [employer].

## 2017-10-02 DIAGNOSIS — D485 Neoplasm of uncertain behavior of skin: Secondary | ICD-10-CM | POA: Diagnosis not present

## 2017-10-02 DIAGNOSIS — L439 Lichen planus, unspecified: Secondary | ICD-10-CM | POA: Diagnosis not present

## 2017-10-09 ENCOUNTER — Encounter: Payer: Self-pay | Admitting: Family Medicine

## 2017-10-29 NOTE — Progress Notes (Signed)
Chief Complaint  Patient presents with  . Hypertension    follow up, patient is not fasting. Pt also still having hot flashes. Does not want bp med that will mak her gain weight.    Patient presents to f/u on hypertension. She has been taking HCTZ 25mg  without side effects. Cut back on crackers, no longer salting food.  Doesn't eat much salty foods in general.  Eats a healthy diet. Exercising regularly and has lost some weight. BP's have been running 135-165/87-102; once noted 178/103 after a "salty weekend".  Mostly upper 130's-mid 150's/90. She is asking about new medications to add, that will not cause weight gain, suggesting losartan, olmesartan (she has friends taking).    PMH, PSH, SH reviewed  Current Outpatient Medications on File Prior to Visit  Medication Sig Dispense Refill  . Calcium Carbonate-Vit D-Min (CALCIUM 1200 PO) Take 1 tablet by mouth daily.    . Cholecalciferol (VITAMIN D3) 5000 units CAPS Take 1 capsule by mouth daily.    . hydrochlorothiazide (HYDRODIURIL) 25 MG tablet Take 1 tablet (25 mg total) by mouth daily. 90 tablet 1  . Multiple Vitamin (MULTIVITAMIN) tablet Take 1 tablet by mouth daily.    . Omega-3 1000 MG CAPS Take 1 capsule by mouth daily.    . valACYclovir (VALTREX) 1000 MG tablet TAKE 0.5 TABLETS (500 MG TOTAL) BY MOUTH 2 TIMES DAILY AS DIRECTED, AS NEEDED FOR RECURRENT INFECTION. (Patient not taking: Reported on 04/12/2015) 30 tablet 5   No current facility-administered medications on file prior to visit.    No Known Allergies  ROS:  No fever, chills, URI symptoms.  She denies headaches, dizziness, chest pain, DOE, swelling, muscle cramps.  PHYSICAL EXAM:  BP 140/80   Pulse 88   Ht 5\' 2"  (1.575 m)   Wt 143 lb 12.8 oz (65.2 kg)   LMP 07/23/2016   BMI 26.30 kg/m    142/82 on repeat by MD  Wt Readings from Last 3 Encounters:  10/30/17 143 lb 12.8 oz (65.2 kg)  07/12/17 147 lb 12.8 oz (67 kg)  06/06/17 151 lb (68.5 kg)   Well appearing,  pleasant female, in good spirits HEENT: conjunctiva and sclera are clear, EOMI Neck: no lymphadenopathy or mass Heart: regular rate and rhythm Lungs: clear bilaterally Extremities: no edema Psych: normal mood, affect, hygiene and grooming   ASSESSMENT/PLAN:  Essential hypertension - suboptimally controlled on HCTZ alone. Add losartan.  f/u as scheduled later this month with fasting labs prior (to f/u lipids, vit D) - Plan: losartan (COZAAR) 50 MG tablet  Need for influenza vaccination - Plan: Flu Vaccine QUAD 6+ mos PF IM (Fluarix Quad PF)    Start 50mg  losartan once daily, in addition to the 25mg  of HCTZ. These can be taken together. If your blood pressure remains >135-140/85-90 after 7-10 days, then increase to 100mg  (2 tablets, taken together). If you have any side effects, let us know. If your blood pressure drops to <105/60 regularly, let us know and we can lower the dose, especially if you feel dizzy.  Return as scheduled for labs 10/30 and visit 10/31. F/u in February as scheduled (CPE)

## 2017-10-30 ENCOUNTER — Encounter: Payer: Self-pay | Admitting: Family Medicine

## 2017-10-30 ENCOUNTER — Ambulatory Visit: Payer: 59 | Admitting: Family Medicine

## 2017-10-30 VITALS — BP 140/80 | HR 88 | Ht 62.0 in | Wt 143.8 lb

## 2017-10-30 DIAGNOSIS — I10 Essential (primary) hypertension: Secondary | ICD-10-CM | POA: Diagnosis not present

## 2017-10-30 DIAGNOSIS — Z23 Encounter for immunization: Secondary | ICD-10-CM

## 2017-10-30 MED ORDER — LOSARTAN POTASSIUM 50 MG PO TABS: 50.0000 mg | ORAL_TABLET | Freq: Every day | ORAL | 1 refills | Status: DC

## 2017-10-30 NOTE — Patient Instructions (Addendum)
Start 50mg  losartan once daily, in addition to the 25mg  of HCTZ. These can be taken together. If your blood pressure remains >135-140/85-90 after 7-10 days, then increase to 100mg  (2 tablets, taken together). If you have any side effects, let us know. If your blood pressure drops to <105/60 regularly, let us know and we can lower the dose, especially if you feel dizzy.  Return as scheduled for labs 10/30 and visit 10/31.   Try Hoy Register to see if this helps with your hot flashes. Barnett Hatter is a new one advertised that I don't have much experience with.  Menopause Menopause is the normal time of life when menstrual periods stop completely. Menopause is complete when you have missed 12 consecutive menstrual periods. It usually occurs between the ages of 47 years and 42 years. Very rarely does a woman develop menopause before the age of 23 years. At menopause, your ovaries stop producing the female hormones estrogen and progesterone. This can cause undesirable symptoms and also affect your health. Sometimes the symptoms may occur 4-5 years before the menopause begins. There is no relationship between menopause and:  Oral contraceptives.  Number of children you had.  Race.  The age your menstrual periods started (menarche).  Heavy smokers and very thin women may develop menopause earlier in life. What are the causes?  The ovaries stop producing the female hormones estrogen and progesterone. Other causes include:  Surgery to remove both ovaries.  The ovaries stop functioning for no known reason.  Tumors of the pituitary gland in the brain.  Medical disease that affects the ovaries and hormone production.  Radiation treatment to the abdomen or pelvis.  Chemotherapy that affects the ovaries.  What are the signs or symptoms?  Hot flashes.  Night sweats.  Decrease in sex drive.  Vaginal dryness and thinning of the vagina causing painful intercourse.  Dryness of the skin and  developing wrinkles.  Headaches.  Tiredness.  Irritability.  Memory problems.  Weight gain.  Bladder infections.  Hair growth of the face and chest.  Infertility. More serious symptoms include:  Loss of bone (osteoporosis) causing breaks (fractures).  Depression.  Hardening and narrowing of the arteries (atherosclerosis) causing heart attacks and strokes.  How is this diagnosed?  When the menstrual periods have stopped for 12 straight months.  Physical exam.  Hormone studies of the blood. How is this treated? There are many treatment choices and nearly as many questions about them. The decisions to treat or not to treat menopausal changes is an individual choice made with your health care provider. Your health care provider can discuss the treatments with you. Together, you can decide which treatment will work best for you. Your treatment choices may include:  Hormone therapy (estrogen and progesterone).  Non-hormonal medicines.  Treating the individual symptoms with medicine (for example antidepressants for depression).  Herbal medicines that may help specific symptoms.  Counseling by a psychiatrist or psychologist.  Group therapy.  Lifestyle changes including: ? Eating healthy. ? Regular exercise. ? Limiting caffeine and alcohol. ? Stress management and meditation.  No treatment.  Follow these instructions at home:  Take the medicine your health care provider gives you as directed.  Get plenty of sleep and rest.  Exercise regularly.  Eat a diet that contains calcium (good for the bones) and soy products (acts like estrogen hormone).  Avoid alcoholic beverages.  Do not smoke.  If you have hot flashes, dress in layers.  Take supplements, calcium, and vitamin D to strengthen  bones.  You can use over-the-counter lubricants or moisturizers for vaginal dryness.  Group therapy is sometimes very helpful.  Acupuncture may be helpful in some  cases. Contact a health care provider if:  You are not sure you are in menopause.  You are having menopausal symptoms and need advice and treatment.  You are still having menstrual periods after age 6 years.  You have pain with intercourse.  Menopause is complete (no menstrual period for 12 months) and you develop vaginal bleeding.  You need a referral to a specialist (gynecologist, psychiatrist, or psychologist) for treatment. Get help right away if:  You have severe depression.  You have excessive vaginal bleeding.  You fell and think you have a broken bone.  You have pain when you urinate.  You develop leg or chest pain.  You have a fast pounding heart beat (palpitations).  You have severe headaches.  You develop vision problems.  You feel a lump in your breast.  You have abdominal pain or severe indigestion. This information is not intended to replace advice given to you by your health care provider. Make sure you discuss any questions you have with your health care provider. Document Released: 04/01/2003 Document Revised: 06/17/2015 Document Reviewed: 08/08/2012 Elsevier Interactive Patient Education  2017 [employer].

## 2017-10-31 ENCOUNTER — Ambulatory Visit: Payer: 59 | Admitting: Family Medicine

## 2017-11-01 ENCOUNTER — Encounter: Payer: Self-pay | Admitting: Family Medicine

## 2017-11-12 ENCOUNTER — Telehealth: Payer: Self-pay | Admitting: Family Medicine

## 2017-11-12 NOTE — Telephone Encounter (Signed)
The week after starting her med she drank 3 large glasses of wine with friends--felt very ill the next day, vomited all day, thought she had the flu. Had diarrhea, sweats, fever. Thought she had a virus or food poisoning. Lasted longer than she thought.  Started Wednesday, started feeling a little better on Saturday, still isn't better. Today she feels okay, feels a lot better.  BP only checked once, was 135/88 yesterday.   Plans to have 1 glass of wine tonight.  If she feels sick, she will stop med tomorrow and call us back.  Suspect viral based on fever, symptoms, no problems the first week.  Discussed limits on alcohol as a general health recommendation.

## 2017-11-12 NOTE — Telephone Encounter (Signed)
Pt called and states the Losartan is making her physically sick.  She has felt like she has had the flu for over 1 week. She has also vomited.  Please call her regarding this med.  [place]

## 2017-11-21 ENCOUNTER — Other Ambulatory Visit: Payer: 59

## 2017-11-21 DIAGNOSIS — E559 Vitamin D deficiency, unspecified: Secondary | ICD-10-CM | POA: Diagnosis not present

## 2017-11-21 DIAGNOSIS — E781 Pure hyperglyceridemia: Secondary | ICD-10-CM | POA: Diagnosis not present

## 2017-11-21 DIAGNOSIS — I10 Essential (primary) hypertension: Secondary | ICD-10-CM | POA: Diagnosis not present

## 2017-11-21 DIAGNOSIS — Z5181 Encounter for therapeutic drug level monitoring: Secondary | ICD-10-CM

## 2017-11-21 NOTE — Progress Notes (Signed)
Chief Complaint  Patient presents with  . Medication Management    Patient presents for follow-up on hypertension. She was seen earlier this month, after starting HCTZ, and found that BP was suboptimally controlled on HCTZ alone. We added losartan 50mg  daily.  She was doing fine on this, until she got sick after she drank 3 large glasses of wine with friends--felt very ill the next day, vomited all day, thought she had the flu. Had diarrhea, sweats, fever. Thought she had a virus or food poisoning. Lasted longer than she thought.  She was initially afraid to drink wine again with the medication. She has been fine since then ,realizes it was the "flu"/stomach bug.  BP's have been running 135/85, better than prior to the addition of losartan.  Once she saw the BP come down to a more "normal" range, she hasn't been checking as frequently, and has also started having some cheese and crackers again. She denies any side effects.  H/o elevated TG. She has been working on diet. She is taking fish oil, just 1/day (knows she was asked to try and increase to 2--doesn't want have bad breath).   See below for most recent labs, done yesterday. These were from June: Lab Results  Component Value Date   CHOL 248 (H) 07/11/2017   HDL 79 07/11/2017   LDLCALC 132 (H) 07/11/2017   TRIG 185 (H) 07/11/2017   CHOLHDL 3.1 07/11/2017   Vitamin D deficiency:  She has been taking 5000 IU daily Previously treated with Rx, last check after taking 5000 IU daily since completing rx therapy was normal at 40.6 in 06/2017.  PMH, [place], SH reviewed  Outpatient Encounter Medications as of 11/22/2017  Medication Sig Note  . Calcium Carbonate-Vit D-Min (CALCIUM 1200 PO) Take 1 tablet by mouth daily.   . Cholecalciferol (VITAMIN D3) 5000 units CAPS Take 1 capsule by mouth daily.   . hydrochlorothiazide (HYDRODIURIL) 25 MG tablet Take 1 tablet (25 mg total) by mouth daily.   Marland Kitchen losartan (COZAAR) 50 MG tablet Take 1 tablet (50 mg  total) by mouth daily.   . Multiple Vitamin (MULTIVITAMIN) tablet Take 1 tablet by mouth daily. 04/12/2015: daily  . Omega-3 1000 MG CAPS Take 1 capsule by mouth daily. 07/12/2017: 1100mg  daily  . valACYclovir (VALTREX) 1000 MG tablet TAKE 0.5 TABLETS (500 MG TOTAL) BY MOUTH 2 TIMES DAILY AS DIRECTED, AS NEEDED FOR RECURRENT INFECTION. 11/26/2013: Uses prn, not needing  . [DISCONTINUED] losartan (COZAAR) 50 MG tablet Take 1 tablet (50 mg total) by mouth daily.    No facility-administered encounter medications on file as of 11/22/2017.    No Known Allergies  ROS: no headaches, dizziness, chest pain, palpitations.  No further nausea, vomiting, diarrhea since illness, all resolved.  No edema, bleeding, bruising, rash, URI symptoms or other concerns. Moods are good.   PHYSICAL EXAM:  BP 130/84   Pulse 71   Temp 98.7 F (37.1 C) (Oral)   Ht 5\' 2"  (1.575 m)   Wt 144 lb (65.3 kg)   LMP 07/23/2016   SpO2 98%   BMI 26.34 kg/m   Wt Readings from Last 3 Encounters:  11/22/17 144 lb (65.3 kg)  10/30/17 143 lb 12.8 oz (65.2 kg)  07/12/17 147 lb 12.8 oz (67 kg)   134/80 on repeat by MD  Well appearing, pleasant female in no distress Neck: no lymphadenopathy or mass Heart: regular rate and rhythm, no murmur Lungs: clear bilaterally  Extremities: no edema Psych: normal mood, affect,  hygiene and grooming    Chemistry      Component Value Date/Time   NA 139 11/21/2017 0845   K 4.7 11/21/2017 0845   CL 97 11/21/2017 0845   CO2 28 11/21/2017 0845   BUN 11 11/21/2017 0845   CREATININE 0.83 11/21/2017 0845   CREATININE 0.70 11/26/2013 1514      Component Value Date/Time   CALCIUM 9.9 11/21/2017 0845   ALKPHOS 74 07/11/2017 0854   AST 28 07/11/2017 0854   ALT 30 07/11/2017 0854   BILITOT 0.4 07/11/2017 0854     Fasting glu 80  Lab Results  Component Value Date   CHOL 213 (H) 11/21/2017   HDL 65 11/21/2017   LDLCALC 108 (H) 11/21/2017   TRIG 199 (H) 11/21/2017   CHOLHDL 3.3  11/21/2017   Vitamin D-OH 31.8  ASSESSMENT/PLAN  Essential hypertension - improved control since adding losartan to HCTZ. improved, still borderline. Cont low Na diet, current meds, monitor. f/u in Feb as sched - Plan: losartan (COZAAR) 50 MG tablet  Hypertriglyceridemia - will see if she can tolerate 2 fish oil/d. Continue lowfat diet. LDL significantly improved. Cont low chol diet  Vitamin D deficiency - on lower end of normal. cont current supplement, 5000 IU qd  May need to increase losartan if BP's remain borderline high; will re-assess at her physical in Feb. In interim, continue current meds, low sodium diet, regular exercise, lowfat, low cholesterol diet

## 2017-11-22 ENCOUNTER — Ambulatory Visit: Payer: 59 | Admitting: Family Medicine

## 2017-11-22 ENCOUNTER — Encounter: Payer: Self-pay | Admitting: Family Medicine

## 2017-11-22 VITALS — BP 130/84 | HR 71 | Temp 98.7°F | Ht 62.0 in | Wt 144.0 lb

## 2017-11-22 DIAGNOSIS — E559 Vitamin D deficiency, unspecified: Secondary | ICD-10-CM | POA: Diagnosis not present

## 2017-11-22 DIAGNOSIS — I10 Essential (primary) hypertension: Secondary | ICD-10-CM

## 2017-11-22 DIAGNOSIS — E781 Pure hyperglyceridemia: Secondary | ICD-10-CM

## 2017-11-22 LAB — VITAMIN D 25 HYDROXY (VIT D DEFICIENCY, FRACTURES): Vit D, 25-Hydroxy: 31.8 ng/mL (ref 30.0–100.0)

## 2017-11-22 LAB — BASIC METABOLIC PANEL
BUN/Creatinine Ratio: 13 (ref 9–23)
BUN: 11 mg/dL (ref 6–24)
CALCIUM: 9.9 mg/dL (ref 8.7–10.2)
CHLORIDE: 97 mmol/L (ref 96–106)
CO2: 28 mmol/L (ref 20–29)
Creatinine, Ser: 0.83 mg/dL (ref 0.57–1.00)
GFR calc Af Amer: 93 mL/min/{1.73_m2} (ref >59)
GFR calc non Af Amer: 81 mL/min/{1.73_m2} (ref >59)
GLUCOSE: 80 mg/dL (ref 65–99)
POTASSIUM: 4.7 mmol/L (ref 3.5–5.2)
SODIUM: 139 mmol/L (ref 134–144)

## 2017-11-22 LAB — LIPID PANEL
CHOL/HDL RATIO: 3.3 ratio (ref 0.0–4.4)
Cholesterol, Total: 213 mg/dL — ABNORMAL HIGH (ref 100–199)
HDL: 65 mg/dL (ref >39)
LDL CALC: 108 mg/dL — AB (ref 0–99)
Triglycerides: 199 mg/dL — ABNORMAL HIGH (ref 0–149)
VLDL CHOLESTEROL CAL: 40 mg/dL (ref 5–40)

## 2017-11-22 MED ORDER — LOSARTAN POTASSIUM 50 MG PO TABS: 50.0000 mg | ORAL_TABLET | Freq: Every day | ORAL | 1 refills | Status: DC

## 2017-11-22 NOTE — Patient Instructions (Signed)
Continue your current medications and 5000 IU of vitamin D3. See if you can tolerate a second fish oil capsule. Keep the cheese and cracker frequency down.  Your cholesterol (LDL, bad kind) was much improved.  Your triglycerides remained slightly elevated (199).

## 2017-12-11 ENCOUNTER — Telehealth: Payer: Self-pay | Admitting: Family Medicine

## 2017-12-11 DIAGNOSIS — B001 Herpesviral vesicular dermatitis: Secondary | ICD-10-CM

## 2017-12-11 MED ORDER — VALACYCLOVIR HCL 1 G PO TABS: ORAL_TABLET | ORAL | 5 refills | Status: AC

## 2017-12-11 NOTE — Telephone Encounter (Signed)
Called and left voicemail for pt that rx was sent to the pharmacy  And left instructions for pt

## 2017-12-11 NOTE — Telephone Encounter (Signed)
Pt called and Is requesting a refill on her valtrex pt states she has a real bad fever blister on her lip., and is out, she would like refills also,  pt uses [facility] [address], [place] - [address] pt can be reached at [phone] and would like a call after this is sent in to the pharmacy

## 2017-12-11 NOTE — Telephone Encounter (Signed)
Advise pt that rx was sent. Instructions for cold sore is to take 2 pills (as soon as possible), and repeat the dose (2 more pills) in 12 hours.  That is full course (4 pills in 12 hours). The bottle also contains instructions for other herpes infections, which are different (1/2 pill twice daily for 3 days).

## 2018-01-27 ENCOUNTER — Other Ambulatory Visit: Payer: Self-pay | Admitting: Family Medicine

## 2018-01-27 DIAGNOSIS — I10 Essential (primary) hypertension: Secondary | ICD-10-CM

## 2018-02-25 ENCOUNTER — Encounter: Payer: Self-pay | Admitting: *Deleted

## 2018-02-27 ENCOUNTER — Other Ambulatory Visit: Payer: Self-pay | Admitting: Family Medicine

## 2018-02-27 DIAGNOSIS — I10 Essential (primary) hypertension: Secondary | ICD-10-CM

## 2018-03-04 ENCOUNTER — Ambulatory Visit: Payer: 59 | Admitting: Family Medicine

## 2018-03-04 ENCOUNTER — Encounter: Payer: Self-pay | Admitting: Family Medicine

## 2018-03-04 VITALS — BP 120/80 | HR 80 | Temp 98.2°F | Ht 62.0 in | Wt 146.8 lb

## 2018-03-04 DIAGNOSIS — J0100 Acute maxillary sinusitis, unspecified: Secondary | ICD-10-CM | POA: Diagnosis not present

## 2018-03-04 DIAGNOSIS — J3489 Other specified disorders of nose and nasal sinuses: Secondary | ICD-10-CM | POA: Diagnosis not present

## 2018-03-04 MED ORDER — AZITHROMYCIN 250 MG PO TABS: ORAL_TABLET | ORAL | 0 refills | Status: DC

## 2018-03-04 NOTE — Patient Instructions (Signed)
Drink plenty of water. Use sudafed or other decongestant (dayquil is fine) regularly--monitor blood pressure to ensure it isn't getting too high.   You can also try taking mucinex to loosen the mucus so that it can drain easier. Retry Neti-pot to see if that can help drain the sinus. Consider trying Afrin if needed for ongoing sinus pain (don't use longer than 2 days). Take the antibiotics as directed. You can continue ibuprofen, up to 800mg  every 8 hours. Be sure to take with food, and cut back on the dose if it starts bothering your stomach.   If not improving, let us know

## 2018-03-04 NOTE — Progress Notes (Signed)
Chief Complaint  Patient presents with  . Facial Pain    left sided, started Sat. No real drainage, more stoppped up onleft side. Teeth hurt, unsure of fever due to taking lots of ibuprofen due to the facial pain. No coughing, body aches or chils.    "I have a sinus infection" Gets them once yearly, always in the same location, on the left. She usually goes to urgent care and is treated with a z-pak. Decided to come here instead of urgent care this time. She is asking for z-pak (easy to be compliant with the course).  She slept on her left side the night before symptoms started.  2 day ago woke up with left upper teeth aching. She tried steamy shower, nasal spray (saline),not getting anything out.  Pain remains in her left cheek.  Yesterday had slight ear discomfort.  Took 600mg  ibuprofen this morning, took.  Took 800mg  twice yesterday. 2 days ago she took Dayquil.   Feels better to lay on her right side and to lean forwards (ie down dog position in yoga).  Denies any drainage from nose, sore throat, postnasal drip or cough. Pain is in left cheek and upper teeth.  PMH, PSH. SH reviewed  Outpatient Encounter Medications as of 03/04/2018  Medication Sig Note  . Calcium Carbonate-Vit D-Min (CALCIUM 1200 PO) Take 1 tablet by mouth daily.   . Cholecalciferol (VITAMIN D3) 5000 units CAPS Take 1 capsule by mouth daily.   . hydrochlorothiazide (HYDRODIURIL) 25 MG tablet TAKE ONE TABLET BY MOUTH DAILY   . ibuprofen (ADVIL,MOTRIN) 200 MG tablet Take 600 mg by mouth every 6 (six) hours as needed. 03/04/2018: Last dose 9am.  . losartan (COZAAR) 50 MG tablet TAKE ONE TABLET BY MOUTH DAILY   . Multiple Vitamin (MULTIVITAMIN) tablet Take 1 tablet by mouth daily. 04/12/2015: daily  . Omega-3 1000 MG CAPS Take 1 capsule by mouth daily. 07/12/2017: 1100mg  daily  . azithromycin (ZITHROMAX) 250 MG tablet Take 2 tablets by mouth on first day, then 1 tablet by mouth on days 2 through 5   . valACYclovir (VALTREX)  1000 MG tablet Take 2 pills at onset of cold sore and repeat once in 12 hours (4 pills/course); take 1/2 pill twice daily x 3d for other herpes infections (Patient not taking: Reported on 03/04/2018)    No facility-administered encounter medications on file as of 03/04/2018.    (not taking zpak prior to today's visit)  No Known Allergies  ROS:  No fever, chills, sore throat, cough, nausea, vomiting, diarrhea, bleeding, bruising, rash or other complaints.  PHYSICAL EXAM:  BP 120/80   Pulse 80   Temp 98.2 F (36.8 C) (Tympanic)   Ht 5\' 2"  (1.575 m)   Wt 146 lb 12.8 oz (66.6 kg)   LMP 07/23/2016   BMI 26.85 kg/m   Pleasant female in moderate discomfort from left cheek pain. HEENT: PERRL, EOMI, conjunctiva and sclera are clear.  Nasal mucosa is mildly edematous, no drainage or erythema.  Tender at left cheek, nontender at other sinuses Neck: No lymphadenopathy or mass Heart: regular rate and rhythm Lungs: clear bilaterally Skin: normal turgor, no rashes Neuro: alert and oriented, cranial nerves intact, normal gait   ASSESSMENT/PLAN:  Sinus pain - suspect poor drainage as cause for acute pain, no necessarily bacterial infection. Decongestants +/- afrin, mucinex, sinus rinses. Rec delay ABX but will start  Acute non-recurrent maxillary sinusitis - hx severe infections; discussed sinus pain from not draining vs infection. She will not  delay start of ABX (suggested). Declines prednisone - Plan: azithromycin (ZITHROMAX) 250 MG tablet   Drink plenty of water. Use sudafed or other decongestant (dayquil is fine) regularly--monitor blood pressure to ensure it isn't getting too high.   You can also try taking mucinex to loosen the mucus so that it can drain easier. Retry Neti-pot to see if that can help drain the sinus. Consider trying Afrin if needed for ongoing sinus pain (don't use longer than 2 days). Take the antibiotics as directed. You can continue ibuprofen, up to 800mg  every 8  hours. Be sure to take with food, and cut back on the dose if it starts bothering your stomach.

## 2018-03-13 ENCOUNTER — Encounter: Payer: 59 | Admitting: Family Medicine

## 2018-03-27 ENCOUNTER — Other Ambulatory Visit: Payer: Self-pay | Admitting: Family Medicine

## 2018-03-27 DIAGNOSIS — I10 Essential (primary) hypertension: Secondary | ICD-10-CM

## 2018-03-27 NOTE — Progress Notes (Signed)
Chief Complaint  Patient presents with  . Hypertension    nonfasting med check. No concerns.   . Medication Refill    would like refill on losartan, hctz and valtrex x 1 year.     Patient presents for follow up on hypertension. She is taking losartan and HCTZ.  BP's are running 130-135/80-85, checks it 2x/week. Takes medication mid-day--today took it later, just prior to her appointment. She denies headaches, dizziness, chest pain, shortness of breath, edema, cough, muscle cramps.  She was last seen for sinus infection, with significant pain in right cheek. She took the antibiotics and symptoms completely resolved.  She does report still having some fullness and discomfort on the left cheek, feels different than on the right.  Her sinus infections are always left sided.  H/o depression: She has been off of citalopram for 2 years.  No recurrent depression or significant anxiety. Feels like she continues to cope with things well. Does Yoga.   Vitamin D deficiency.  She continues to take 5000 IU daily. Last check was 31.8 in 10/2017  Elevated TG:  She is trying to follow lowfat diet. She is taking fish oil, just 1/day (knows she was asked to try and increase to 2--doesn't want have bad breath).  She cut back on her wine, 2 glasses twice a week (prev 3-4).   Lab Results  Component Value Date   CHOL 213 (H) 11/21/2017   HDL 65 11/21/2017   LDLCALC 108 (H) 11/21/2017   TRIG 199 (H) 11/21/2017   CHOLHDL 3.3 11/21/2017   PMH, PSH, SH reviewed  Outpatient Encounter Medications as of 03/28/2018  Medication Sig Note  . Cholecalciferol (VITAMIN D3) 5000 units CAPS Take 1 capsule by mouth daily.   . hydrochlorothiazide (HYDRODIURIL) 25 MG tablet Take 1 tablet (25 mg total) by mouth daily.   Marland Kitchen losartan (COZAAR) 50 MG tablet Take 1 tablet (50 mg total) by mouth daily.   . Multiple Vitamin (MULTIVITAMIN) tablet Take 1 tablet by mouth daily. 04/12/2015: daily  . Omega-3 1000 MG CAPS Take 1 capsule  by mouth daily. 07/12/2017: 1100mg  daily  . [DISCONTINUED] Calcium Carbonate-Vit D-Min (CALCIUM 1200 PO) Take 1 tablet by mouth daily.   . [DISCONTINUED] hydrochlorothiazide (HYDRODIURIL) 25 MG tablet TAKE ONE TABLET BY MOUTH DAILY   . [DISCONTINUED] losartan (COZAAR) 50 MG tablet TAKE ONE TABLET BY MOUTH DAILY   . ibuprofen (ADVIL,MOTRIN) 200 MG tablet Take 600 mg by mouth every 6 (six) hours as needed. 03/04/2018: Last dose 9am.  . valACYclovir (VALTREX) 1000 MG tablet Take 2 pills at onset of cold sore and repeat once in 12 hours (4 pills/course); take 1/2 pill twice daily x 3d for other herpes infections (Patient not taking: Reported on 03/04/2018)   . [DISCONTINUED] azithromycin (ZITHROMAX) 250 MG tablet Take 2 tablets by mouth on first day, then 1 tablet by mouth on days 2 through 5    No facility-administered encounter medications on file as of 03/28/2018.    No Known Allergies  ROS: no fever, chills, URI symptoms, headaches, dizziness, chest pain, palpitations, muscle cramps, depression, anxiety, joint pain, bleeding, bruising, rashes, edema or other concerns.    PHYSICAL EXAM:  BP 114/72   Pulse 80   Ht 5\' 2"  (1.575 m)   Wt 145 lb 9.6 oz (66 kg)   LMP 07/23/2016   BMI 26.63 kg/m   Wt Readings from Last 3 Encounters:  03/28/18 145 lb 9.6 oz (66 kg)  03/04/18 146 lb 12.8 oz (66.6  kg)  11/22/17 144 lb (65.3 kg)   136/86 on repeat by MD  Well-appearing, pleasant female, in good spirits HEENT: conjunctiva and sclera are clear, EOMI. Still has just very slight residual tenderness on percussion of the left cheek. Mild edema of nasal mucosa. OP clear Neck: no lymphadenopathy or mass Heart: regular rate and rhythm, no murmur Extremities: no edema, normal pulses Psych: normal mood, affect, hygiene and grooming Skin: normal turgor, no rash   ASSESSMENT/PLAN:  Frequent sinus infections - likely has some underlying allergies. Recommended trial of Flonase. Discussed proper use  (OTC)  Essential hypertension - Plan: hydrochlorothiazide (HYDRODIURIL) 25 MG tablet, losartan (COZAAR) 50 MG tablet  Essential hypertension - cont losartan and HCTZ. Initial BP here good, recheck borderline, similar to home BP's. Cont current regimen; f/u sooner if increases, o/w f/u 6 mos.   - Plan: hydrochlorothiazide (HYDRODIURIL) 25 MG tablet, losartan (COZAAR) 50 MG tablet   6 months CPE/med check  Continue your current medications. Periodically monitor blood pressure at home, and if you see it consistently over 135-140/85-90, please return sooner for recheck, rather than waiting until your physical.  Consider using Flonase if/when allergies start (runny nose, congestion, or increased sinus pressure).

## 2018-03-27 NOTE — Telephone Encounter (Signed)
She needs an appt correct?

## 2018-03-27 NOTE — Telephone Encounter (Signed)
BP was borderline at her last med check, said I'd re-assess at her physical in Feb. She came in Feb for sick visit, BP was good. She canceled her CPE. Needs to schedule med check, okay to be in April.  Okay to refill meds x 1 month and schedule med check within that time. Also may want to r/s her CPE, consider cancellation list (last was 02/2017)

## 2018-03-28 ENCOUNTER — Ambulatory Visit: Payer: 59 | Admitting: Family Medicine

## 2018-03-28 ENCOUNTER — Encounter: Payer: Self-pay | Admitting: Family Medicine

## 2018-03-28 VITALS — BP 114/72 | HR 80 | Ht 62.0 in | Wt 145.6 lb

## 2018-03-28 DIAGNOSIS — I10 Essential (primary) hypertension: Secondary | ICD-10-CM | POA: Diagnosis not present

## 2018-03-28 DIAGNOSIS — J329 Chronic sinusitis, unspecified: Secondary | ICD-10-CM | POA: Diagnosis not present

## 2018-03-28 MED ORDER — HYDROCHLOROTHIAZIDE 25 MG PO TABS: 25.0000 mg | ORAL_TABLET | Freq: Every day | ORAL | 1 refills | Status: DC

## 2018-03-28 MED ORDER — LOSARTAN POTASSIUM 50 MG PO TABS: 50.0000 mg | ORAL_TABLET | Freq: Every day | ORAL | 1 refills | Status: DC

## 2018-03-28 NOTE — Patient Instructions (Signed)
  Continue your current medications. Periodically monitor blood pressure at home, and if you see it consistently over 135-140/85-90, please return sooner for recheck, rather than waiting until your physical.  Consider using Flonase if/when allergies start (runny nose, congestion, or increased sinus pressure).

## 2018-03-29 ENCOUNTER — Encounter: Payer: Self-pay | Admitting: Family Medicine

## 2018-04-27 ENCOUNTER — Other Ambulatory Visit: Payer: Self-pay | Admitting: Family Medicine

## 2018-04-27 DIAGNOSIS — I10 Essential (primary) hypertension: Secondary | ICD-10-CM

## 2018-04-29 NOTE — Telephone Encounter (Signed)
These were done for her at her last visit, 3/5 for 6 months to [facility].  Shouldn't need these refills

## 2018-04-29 NOTE — Telephone Encounter (Signed)
Would you like me to schedule a virtual med check?

## 2018-04-29 NOTE — Telephone Encounter (Signed)
Called [facility] and they did not receive, will resend.

## 2018-06-17 ENCOUNTER — Encounter: Payer: Self-pay | Admitting: Gastroenterology

## 2018-10-22 ENCOUNTER — Telehealth: Payer: Self-pay | Admitting: *Deleted

## 2018-10-22 DIAGNOSIS — Z0000 Encounter for general adult medical examination without abnormal findings: Secondary | ICD-10-CM

## 2018-10-22 DIAGNOSIS — I10 Essential (primary) hypertension: Secondary | ICD-10-CM

## 2018-10-22 DIAGNOSIS — E559 Vitamin D deficiency, unspecified: Secondary | ICD-10-CM

## 2018-10-22 DIAGNOSIS — Z5181 Encounter for therapeutic drug level monitoring: Secondary | ICD-10-CM

## 2018-10-22 DIAGNOSIS — E781 Pure hyperglyceridemia: Secondary | ICD-10-CM

## 2018-10-22 NOTE — Telephone Encounter (Signed)
Patient canceled CPE for next Monday, she moved to [place] and is there now. She still has her house her and wants to remain a patient. She rescheduled to 12/18/2018 and labs for 12/17/2018-need orders please.

## 2018-10-22 NOTE — Telephone Encounter (Signed)
Orders entered

## 2018-10-24 ENCOUNTER — Other Ambulatory Visit: Payer: Self-pay | Admitting: Family Medicine

## 2018-10-24 DIAGNOSIS — I10 Essential (primary) hypertension: Secondary | ICD-10-CM

## 2018-10-25 ENCOUNTER — Other Ambulatory Visit: Payer: 59

## 2018-10-28 ENCOUNTER — Encounter: Payer: 59 | Admitting: Family Medicine

## 2018-12-16 NOTE — Patient Instructions (Addendum)
HEALTH MAINTENANCE RECOMMENDATIONS:  It is recommended that you get at least 30 minutes of aerobic exercise at least 5 days/week (for weight loss, you may need as much as 60-90 minutes). This can be any activity that gets your heart rate up. This can be divided in 10-15 minute intervals if needed, but try and build up your endurance at least once a week.  Weight bearing exercise is also recommended twice weekly.  Eat a healthy diet with lots of vegetables, fruits and fiber.  "Colorful" foods have a lot of vitamins (ie green vegetables, tomatoes, red peppers, etc).  Limit sweet tea, regular sodas and alcoholic beverages, all of which has a lot of calories and sugar.  Up to 1 alcoholic drink daily may be beneficial for women (unless trying to lose weight, watch sugars).  Drink a lot of water.  Calcium recommendations are 1200-1500 mg daily (1500 mg for postmenopausal women or women without ovaries), and vitamin D 1000 IU daily.  This should be obtained from diet and/or supplements (vitamins), and calcium should not be taken all at once, but in divided doses.  Monthly self breast exams and yearly mammograms for women over the age of 17 is recommended.  Sunscreen of at least SPF 30 should be used on all sun-exposed parts of the skin when outside between the hours of 10 am and 4 pm (not just when at beach or pool, but even with exercise, golf, tennis, and yard work!)  Use a sunscreen that says "broad spectrum" so it covers both UVA and UVB rays, and make sure to reapply every 1-2 hours.  Remember to change the batteries in your smoke detectors when changing your clock times in the spring and fall. Carbon monoxide detectors are recommended for your home.  Use your seat belt every time you are in a car, and please drive safely and not be distracted with cell phones and texting while driving.  You are due for mammogram. You are due for colonoscopy (3 year follow-up due to polyps). Either contact   GI and schedule for December vs finding a GI doctor in [facility].  I recommend getting the new shingles vaccine (Shingrix). You will need to check with your insurance to see if it is covered, and if covered, schedule a nurse visit when convenient.  It is a series of 2 injections, spaced 2 months apart.  If you choose to get this from the pharmacy, just verify with your insurance about the cost/coverage at pharmacy vs in office.    Insomnia Insomnia is a sleep disorder that makes it difficult to fall asleep or stay asleep. Insomnia can cause fatigue, low energy, difficulty concentrating, mood swings, and poor performance at work or school. There are three different ways to classify insomnia:  Difficulty falling asleep.  Difficulty staying asleep.  Waking up too early in the morning. Any type of insomnia can be long-term (chronic) or short-term (acute). Both are common. Short-term insomnia usually lasts for three months or less. Chronic insomnia occurs at least three times a week for longer than three months. What are the causes? Insomnia may be caused by another condition, situation, or substance, such as:  Anxiety.  Certain medicines.  Gastroesophageal reflux disease (GERD) or other gastrointestinal conditions.  Asthma or other breathing conditions.  Restless legs syndrome, sleep apnea, or other sleep disorders.  Chronic pain.  Menopause.  Stroke.  Abuse of alcohol, tobacco, or illegal drugs.  Mental health conditions, such as depression.  Caffeine.  Neurological disorders,  such as Alzheimer's disease.  An overactive thyroid (hyperthyroidism). Sometimes, the cause of insomnia may not be known. What increases the risk? Risk factors for insomnia include:  Gender. Women are affected more often than men.  Age. Insomnia is more common as you get older.  Stress.  Lack of exercise.  Irregular work schedule or working night shifts.  Traveling between different time  zones.  Certain medical and mental health conditions. What are the signs or symptoms? If you have insomnia, the main symptom is having trouble falling asleep or having trouble staying asleep. This may lead to other symptoms, such as:  Feeling fatigued or having low energy.  Feeling nervous about going to sleep.  Not feeling rested in the morning.  Having trouble concentrating.  Feeling irritable, anxious, or depressed. How is this diagnosed? This condition may be diagnosed based on:  Your symptoms and medical history. Your health care provider may ask about: ? Your sleep habits. ? Any medical conditions you have. ? Your mental health.  A physical exam. How is this treated? Treatment for insomnia depends on the cause. Treatment may focus on treating an underlying condition that is causing insomnia. Treatment may also include:  Medicines to help you sleep.  Counseling or therapy.  Lifestyle adjustments to help you sleep better. Follow these instructions at home: Eating and drinking   Limit or avoid alcohol, caffeinated beverages, and cigarettes, especially close to bedtime. These can disrupt your sleep.  Do not eat a large meal or eat spicy foods right before bedtime. This can lead to digestive discomfort that can make it hard for you to sleep. Sleep habits   Keep a sleep diary to help you and your health care provider figure out what could be causing your insomnia. Write down: ? When you sleep. ? When you wake up during the night. ? How well you sleep. ? How rested you feel the next day. ? Any side effects of medicines you are taking. ? What you eat and drink.  Make your bedroom a dark, comfortable place where it is easy to fall asleep. ? Put up shades or blackout curtains to block light from outside. ? Use a white noise machine to block noise. ? Keep the temperature cool.  Limit screen use before bedtime. This includes: ? Watching TV. ? Using your smartphone,  tablet, or computer.  Stick to a routine that includes going to bed and waking up at the same times every day and night. This can help you fall asleep faster. Consider making a quiet activity, such as reading, part of your nighttime routine.  Try to avoid taking naps during the day so that you sleep better at night.  Get out of bed if you are still awake after 15 minutes of trying to sleep. Keep the lights down, but try reading or doing a quiet activity. When you feel sleepy, go back to bed. General instructions  Take over-the-counter and prescription medicines only as told by your health care provider.  Exercise regularly, as told by your health care provider. Avoid exercise starting several hours before bedtime.  Use relaxation techniques to manage stress. Ask your health care provider to suggest some techniques that may work well for you. These may include: ? Breathing exercises. ? Routines to release muscle tension. ? Visualizing peaceful scenes.  Make sure that you drive carefully. Avoid driving if you feel very sleepy.  Keep all follow-up visits as told by your health care provider. This is important. Contact  a health care provider if:  You are tired throughout the day.  You have trouble in your daily routine due to sleepiness.  You continue to have sleep problems, or your sleep problems get worse. Get help right away if:  You have serious thoughts about hurting yourself or someone else. If you ever feel like you may hurt yourself or others, or have thoughts about taking your own life, get help right away. You can go to your nearest emergency department or call:  Your local emergency services (911 in the [place]  A suicide crisis helpline, such as the [place] at (562)198-0058. This is open 24 hours a day. Summary  Insomnia is a sleep disorder that makes it difficult to fall asleep or stay asleep.  Insomnia can be long-term (chronic) or  short-term (acute).  Treatment for insomnia depends on the cause. Treatment may focus on treating an underlying condition that is causing insomnia.  Keep a sleep diary to help you and your health care provider figure out what could be causing your insomnia. This information is not intended to replace advice given to you by your health care provider. Make sure you discuss any questions you have with your health care provider. Document Released: 01/07/2000 Document Revised: 12/22/2016 Document Reviewed: 10/19/2016 Elsevier Patient Education  2020 [employer].

## 2018-12-16 NOTE — Progress Notes (Signed)
Chief Complaint  Patient presents with  . Annual Exam    annual exam with pelvic. Patient is not fasting. No concerns.     Sherry Carter is a 53 y.o. female who presents for a complete physical. She didn't come for labs done prior to visit, and isn't fasting today.  She heads back to [place] on Saturday.  Hypertension. She is taking losartan and HCTZ.  BP's are no longer being checked elsewhere. She denies headaches, dizziness, chest pain, shortness of breath, edema, cough, muscle cramps. She has a monitor, but hasn't unpacked it since she moved.  H/o depression:She has been off ofcitalopramfor over 2 years. No recurrent depression or significant anxiety. Feels like she continues to cope with things well. Does Yoga which helps.  She thinks moving has also been good for her.  Lately she has been having trouble sleeping.  Taking Simply Sleep often, most nights to help her sleep.  Was taking just 25mg  at first. After 6 months it wasn't as effective, so increased to 50mg  and is working well.  Feels groggy the next day. She got weird dreams from melatonin.  She has decreased her wine intake (since lives alone, less social). Drinks 4 bottles/month (used to have 2/week).  Drinks 2 glasses on the weekend days.  Vitamin D deficiency.  She continues to take 5000 IU daily. Last check was 31.8 in 10/2017.  Elevated TG:  She is trying to follow lowfat diet. She is taking fish oil, just 1/day (previously recommended increasing to 2, didn't want to have bad breath). She cut back on her wine, 2 glasses twice a week (prev 3-4). Eats less cheese, never has it when by herself.  Lab Results  Component Value Date   CHOL 213 (H) 11/21/2017   HDL 65 11/21/2017   LDLCALC 108 (H) 11/21/2017   TRIG 199 (H) 11/21/2017   CHOLHDL 3.3 11/21/2017   01/2018 her daughter OD'd and was intubated and in the ICU.  She was hospitalized x 1 month.  She moved to [facility] ([place]), initially because [place] was  supposed to go to rehab there.  [place] changed her mind, patient moved anyway, and is happy there.  She works remotely.  [place] has done well.  Did rehab in [place] and is in a sober living house in [place] ([place]), sober x 90d.  Immunization History  Administered Date(s) Administered  . Influenza,inj,Quad PF,6+ Mos 10/30/2017  . Tdap 12/01/2010   Last Pap smear: 02/2017; normal, no high risk HPV, negative chlamydia/GC Not currently in a sexual relationship. Last mammogram:03/2017 Last colonoscopy: 05/2015, Dr. Havery Moros; multiple polyps (adenomatous, and a hyperplastic polyp); internal hemorrhoids.  3 yr f/u recommended Last DEXA: never  Dentist: twice yearly  Ophtho: every other year, past due Exercise: Yoga 2x/week (flow yoga) or pilates burn class. Walks dog 30 minutes 5x/week.  Past Medical History:  Diagnosis Date  . Allergy   . Colon polyps 05/2015   (tubular adenomas, and hyperplastic)  . Depressive disorder, not elsewhere classified    situational depression  . Diverticulosis 05/2015   seen on colonoscopy  . Heart murmur    younger dx'd , not always heard   . Herpes labialis    also gets outbreaks of herpes infection on her R thumb  . Internal hemorrhoids 05/2015   seen on colonoscopy  . Pure hypercholesterolemia    elevated TG 2014  . Vitamin D deficiency 10/2012    Past Surgical History:  Procedure Laterality Date  .  WISDOM TOOTH EXTRACTION      Social History   Socioeconomic History  . Marital status: Divorced    Spouse name: Not on file  . Number of children: 2  . Years of education: Not on file  . Highest education level: Not on file  Occupational History  . Occupation: sales (HR outsourcing-)-recruiting    Employer: [employer]  Social Needs  . Financial resource strain: Not on file  . Food insecurity    Worry: Not on file    Inability: Not on file  . Transportation needs    Medical: Not on file    Non-medical: Not  on file  Tobacco Use  . Smoking status: Never Smoker  . Smokeless tobacco: Never Used  Substance and Sexual Activity  . Alcohol use: Yes    Alcohol/week: 0.0 standard drinks    Comment: twice a week, 2 glasses of wine  . Drug use: No  . Sexual activity: Not Currently    Partners: Male    Birth control/protection: None  Lifestyle  . Physical activity    Days per week: Not on file    Minutes per session: Not on file  . Stress: Not on file  Relationships  . Social Herbalist on phone: Not on file    Gets together: Not on file    Attends religious service: Not on file    Active member of club or organization: Not on file    Attends meetings of clubs or organizations: Not on file    Relationship status: Not on file  . Intimate partner violence    Fear of current or ex partner: Not on file    Emotionally abused: Not on file    Physically abused: Not on file    Forced sexual activity: Not on file  Other Topics Concern  . Not on file  Social History Narrative   Divorced.  Shared custody of her 2 children. 1 dog.  Son Hart Carwin is back at [employer], will be graduating in May 2020. Considering chemistry Ph.D.      Daughter Debe Coder has psych problems, drug use--lives in sober living environment in [place].  Had OD 01/2018 (in ICU x 1 month).   Lives in townhome in [facility] (son is living there); she is currently living in [place] (rent[place]).    Family History  Problem Relation Age of Onset  . Hypertension Father   . Asthma Son   . Cancer Maternal Aunt        melanoma  . Cancer Maternal Aunt 60       colon cancer  . Colon cancer Maternal Aunt 75  . Drug abuse Daughter   . Diabetes Neg Hx   . Colon polyps Neg Hx   . Esophageal cancer Neg Hx   . Rectal cancer Neg Hx   . Stomach cancer Neg Hx   . Breast cancer Neg Hx     Outpatient Encounter Medications as of 12/18/2018  Medication Sig Note  . Cholecalciferol (VITAMIN D3) 5000 units CAPS  Take 1 capsule by mouth daily.   . hydrochlorothiazide (HYDRODIURIL) 25 MG tablet TAKE 1 TABLET BY MOUTH EVERY DAY   . losartan (COZAAR) 50 MG tablet TAKE 1 TABLET BY MOUTH EVERY DAY   . Multiple Vitamin (MULTIVITAMIN) tablet Take 1 tablet by mouth daily. 04/12/2015: daily  . Omega-3 1000 MG CAPS Take 1 capsule by mouth daily. 07/12/2017: 1100mg  daily  . ibuprofen (ADVIL,MOTRIN) 200  MG tablet Take 600 mg by mouth every 6 (six) hours as needed. 03/04/2018: Last dose 9am.  . valACYclovir (VALTREX) 1000 MG tablet Take 2 pills at onset of cold sore and repeat once in 12 hours (4 pills/course); take 1/2 pill twice daily x 3d for other herpes infections (Patient not taking: Reported on 03/04/2018)    No facility-administered encounter medications on file as of 12/18/2018.     No Known Allergies  ROS: The patient denies anorexia, fever, headaches, vision changes, decreased hearing, ear pain, sore throat, breast concerns, chest pain, palpitations, dizziness, syncope, dyspnea on exertion, cough, swelling, nausea, vomiting, diarrhea, constipation, abdominal pain, melena, hematochezia, indigestion/heartburn, hematuria, incontinence, dysuria, vaginal bleeding, discharge, odor or itch, genital lesions, numbness, tingling, weakness, tremor, suspicious skin lesions, depression, anxiety, abnormal bleeding/bruising, or enlarged lymph nodes.  +hot flashes, night sweats, infrequent and tolerable. Insomnia per HPI   PHYSICAL EXAM:  BP 130/80   Pulse 72   Temp (!) 96.8 F (36 C) (Other (Comment))   Ht 5\' 2"  (1.575 m)   Wt 149 lb (67.6 kg)   LMP 07/23/2016   BMI 27.25 kg/m   Wt Readings from Last 3 Encounters:  12/18/18 149 lb (67.6 kg)  03/28/18 145 lb 9.6 oz (66 kg)  03/04/18 146 lb 12.8 oz (66.6 kg)    General Appearance:  Alert, cooperative, no distress, appears stated age   Head:  Normocephalic, without obvious abnormality, atraumatic   Eyes:  PERRL, conjunctiva/corneas clear, EOM's  intact, fundi benign   Ears:  Normal TM's and external ear canals   Nose:  Not examined, wearing mask due to COVID-19 pandemic  Throat:  Not examined, wearing mask due to COVID-19 pandemic  Neck:  Supple, no lymphadenopathy; thyroid: no enlargement/tenderness/ nodules; no carotid bruit or JVD   Back:  Spine nontender, no curvature, ROM normal, no CVA tenderness   Lungs:  Clear to auscultation bilaterally without wheezes, rales or ronchi; respirations unlabored   Chest Wall:  No tenderness or deformity   Heart:  Regular rate and rhythm, S1 and S2 normal, no murmur, rub or gallop   Breast Exam:  No masses, nipple discharge or inversion. No axillary lymphadenopathy. Fibroglandular changes noted, bilaterally, more prominent in LUOQ (1-2 o'clock) than on the right.  No dominant or discrete mass  Abdomen:  Soft, non-tender, nondistended, normoactive bowel sounds, no masses, no hepatosplenomegaly   Genitalia:  Normal external genitalia without lesions. BUS and vagina normal; no cervical motion tenderness. No abnormal vaginal discharge. Uterus and adnexa not enlarged, nontender, no masses. Pap not performed  Rectal:  Normal sphincter tone. No mass. Heme negative brown stool  Extremities:  No clubbing, cyanosis or edema   Pulses:  2+ and symmetric all extremities   Skin:  Skin color, texture, turgor normal, no rashes.   Lymph nodes:  Cervical, supraclavicular, and axillary nodes normal   Neurologic:  CNII-XII intact, normal strength, sensation and gait; reflexes 2+ and symmetric throughout    Psych: Normal mood, affect, hygiene and grooming   ASSESSMENT/PLAN:  Annual physical exam  Essential hypertension - well controlled, continue current medications. Periodically check BP at home - Plan: hydrochlorothiazide (HYDRODIURIL) 25 MG tablet, losartan (COZAAR) 50 MG tablet  Hypertriglyceridemia - due for labs. Will  return for fasting labs in December  Vitamin D deficiency - continue daily supplement  Need for influenza vaccination - Plan: Flu Vaccine QUAD 6+ mos PF IM (Fluarix Quad PF)   mammo past due, advised to schedule (while here in December,  vs finding a place in [place]. Colonoscopy past due--discussed scheduling while in [place] in December, vs in [place].  Discussed the importance of getting this scheduled.  She checked insurance and Shingrix is covered. After discussing potential side effects, elected not to get today (tomorrow is Thanksgiving).  May get at the pharmacy in [facility] if covered to get there.   Discussed monthly self breast exams and yearly mammograms (past due), safe sex; at least 30 minutes of aerobic activity at least 5 days/week, weight bearing exercise at least 2x/week; proper sunscreen use reviewed; healthy diet, including goals of calcium and vitamin D intake and alcohol recommendations (less than or equal to 1 drink/day) reviewed; regular seatbelt use; changing batteries in smoke detectors. Immunization recommendations discussed--flu shot given. Shingrix recommended--risks/side effects reviewed, schedule when convenient. Colonoscopy recommendations reviewed. 3 yr f/u recommended for 05/2018, past due.  Reminded to schedule.  To return for fasting labs when she comes back to [place] in December (declined getting lab order to get done in [place]).  F/u 1 year, sooner prn.

## 2018-12-17 ENCOUNTER — Other Ambulatory Visit: Payer: Self-pay

## 2018-12-17 ENCOUNTER — Other Ambulatory Visit: Payer: 59

## 2018-12-18 ENCOUNTER — Encounter: Payer: Self-pay | Admitting: Family Medicine

## 2018-12-18 ENCOUNTER — Ambulatory Visit (INDEPENDENT_AMBULATORY_CARE_PROVIDER_SITE_OTHER): Payer: 59 | Admitting: Family Medicine

## 2018-12-18 ENCOUNTER — Other Ambulatory Visit: Payer: Self-pay

## 2018-12-18 VITALS — BP 130/80 | HR 72 | Temp 96.8°F | Ht 62.0 in | Wt 149.0 lb

## 2018-12-18 DIAGNOSIS — E781 Pure hyperglyceridemia: Secondary | ICD-10-CM | POA: Diagnosis not present

## 2018-12-18 DIAGNOSIS — Z23 Encounter for immunization: Secondary | ICD-10-CM | POA: Diagnosis not present

## 2018-12-18 DIAGNOSIS — E559 Vitamin D deficiency, unspecified: Secondary | ICD-10-CM | POA: Diagnosis not present

## 2018-12-18 DIAGNOSIS — Z0000 Encounter for general adult medical examination without abnormal findings: Secondary | ICD-10-CM

## 2018-12-18 DIAGNOSIS — I10 Essential (primary) hypertension: Secondary | ICD-10-CM

## 2018-12-18 MED ORDER — HYDROCHLOROTHIAZIDE 25 MG PO TABS: 25.0000 mg | ORAL_TABLET | Freq: Every day | ORAL | 3 refills | Status: DC

## 2018-12-18 MED ORDER — LOSARTAN POTASSIUM 50 MG PO TABS: 50.0000 mg | ORAL_TABLET | Freq: Every day | ORAL | 3 refills | Status: DC

## 2019-01-14 ENCOUNTER — Other Ambulatory Visit: Payer: 59

## 2019-06-05 ENCOUNTER — Other Ambulatory Visit: Payer: Self-pay

## 2019-06-05 ENCOUNTER — Other Ambulatory Visit: Payer: Commercial Managed Care - HMO

## 2019-06-05 DIAGNOSIS — I10 Essential (primary) hypertension: Secondary | ICD-10-CM

## 2019-06-05 DIAGNOSIS — E559 Vitamin D deficiency, unspecified: Secondary | ICD-10-CM

## 2019-06-05 DIAGNOSIS — Z0000 Encounter for general adult medical examination without abnormal findings: Secondary | ICD-10-CM

## 2019-06-05 DIAGNOSIS — E781 Pure hyperglyceridemia: Secondary | ICD-10-CM

## 2019-06-05 DIAGNOSIS — Z5181 Encounter for therapeutic drug level monitoring: Secondary | ICD-10-CM

## 2019-06-06 ENCOUNTER — Encounter: Payer: Self-pay | Admitting: Family Medicine

## 2019-06-06 LAB — CBC WITH DIFFERENTIAL/PLATELET
Basophils Absolute: 0 10*3/uL (ref 0.0–0.2)
Basos: 1 %
EOS (ABSOLUTE): 0.1 10*3/uL (ref 0.0–0.4)
Eos: 2 %
Hematocrit: 43.8 % (ref 34.0–46.6)
Hemoglobin: 14.2 g/dL (ref 11.1–15.9)
Immature Grans (Abs): 0 10*3/uL (ref 0.0–0.1)
Immature Granulocytes: 0 %
Lymphocytes Absolute: 2.4 10*3/uL (ref 0.7–3.1)
Lymphs: 46 %
MCH: 30.1 pg (ref 26.6–33.0)
MCHC: 32.4 g/dL (ref 31.5–35.7)
MCV: 93 fL (ref 79–97)
Monocytes Absolute: 0.4 10*3/uL (ref 0.1–0.9)
Monocytes: 7 %
Neutrophils Absolute: 2.3 10*3/uL (ref 1.4–7.0)
Neutrophils: 44 %
Platelets: 278 10*3/uL (ref 150–450)
RBC: 4.71 x10E6/uL (ref 3.77–5.28)
RDW: 12.7 % (ref 11.7–15.4)
WBC: 5.1 10*3/uL (ref 3.4–10.8)

## 2019-06-06 LAB — LIPID PANEL
Chol/HDL Ratio: 3.2 ratio (ref 0.0–4.4)
Cholesterol, Total: 257 mg/dL — ABNORMAL HIGH (ref 100–199)
HDL: 80 mg/dL (ref >39)
LDL Chol Calc (NIH): 138 mg/dL — ABNORMAL HIGH (ref 0–99)
Triglycerides: 220 mg/dL — ABNORMAL HIGH (ref 0–149)
VLDL Cholesterol Cal: 39 mg/dL (ref 5–40)

## 2019-06-06 LAB — COMPREHENSIVE METABOLIC PANEL
ALT: 30 IU/L (ref 0–32)
AST: 20 IU/L (ref 0–40)
Albumin/Globulin Ratio: 1.8 (ref 1.2–2.2)
Albumin: 4.7 g/dL (ref 3.8–4.9)
Alkaline Phosphatase: 71 IU/L (ref 39–117)
BUN/Creatinine Ratio: 15 (ref 9–23)
BUN: 12 mg/dL (ref 6–24)
Bilirubin Total: 0.3 mg/dL (ref 0.0–1.2)
CO2: 25 mmol/L (ref 20–29)
Calcium: 10.3 mg/dL — ABNORMAL HIGH (ref 8.7–10.2)
Chloride: 98 mmol/L (ref 96–106)
Creatinine, Ser: 0.81 mg/dL (ref 0.57–1.00)
GFR calc Af Amer: 95 mL/min/{1.73_m2} (ref >59)
GFR calc non Af Amer: 83 mL/min/{1.73_m2} (ref >59)
Globulin, Total: 2.6 g/dL (ref 1.5–4.5)
Glucose: 91 mg/dL (ref 65–99)
Potassium: 4.9 mmol/L (ref 3.5–5.2)
Sodium: 140 mmol/L (ref 134–144)
Total Protein: 7.3 g/dL (ref 6.0–8.5)

## 2019-06-06 LAB — VITAMIN D 25 HYDROXY (VIT D DEFICIENCY, FRACTURES): Vit D, 25-Hydroxy: 29.5 ng/mL — ABNORMAL LOW (ref 30.0–100.0)

## 2020-01-14 ENCOUNTER — Encounter: Payer: 59 | Admitting: Family Medicine

## 2020-01-29 ENCOUNTER — Other Ambulatory Visit: Payer: Self-pay | Admitting: Family Medicine

## 2020-01-29 DIAGNOSIS — I10 Essential (primary) hypertension: Secondary | ICD-10-CM
